# Patient Record
Sex: Male | Born: 1985 | Hispanic: No | Marital: Single | Smoking: Current every day smoker
Health system: Southern US, Community
[De-identification: ages and names within clinical notes are randomized; demographics above are authoritative.]

---

## 2014-06-16 ENCOUNTER — Other Ambulatory Visit (HOSPITAL_COMMUNITY): Payer: Self-pay | Admitting: PHYSICIAN ASSISTANT

## 2014-06-16 DIAGNOSIS — R160 Hepatomegaly, not elsewhere classified: Secondary | ICD-10-CM

## 2014-06-16 DIAGNOSIS — R748 Abnormal levels of other serum enzymes: Secondary | ICD-10-CM

## 2014-07-03 ENCOUNTER — Ambulatory Visit
Admission: RE | Admit: 2014-07-03 | Discharge: 2014-07-03 | Disposition: A | Discharge status: Home / Self Care | Payer: No Typology Code available for payment source | Source: Ambulatory Visit | Attending: PHYSICIAN ASSISTANT | Admitting: PHYSICIAN ASSISTANT

## 2014-07-03 DIAGNOSIS — R748 Abnormal levels of other serum enzymes: Secondary | ICD-10-CM

## 2014-07-03 DIAGNOSIS — R160 Hepatomegaly, not elsewhere classified: Secondary | ICD-10-CM

## 2014-07-03 MED ORDER — IOPAMIDOL 300 MG IODINE/ML (61 %) INTRAVENOUS SOLUTION
120.00 mL | INTRAVENOUS | Status: AC
Start: 2014-07-03 — End: 2014-07-03
  Administered 2014-07-03: 120 mL via INTRAVENOUS

## 2014-07-03 MED ADMIN — CEFTRIAXONE IVPB PEDIATRIC - 10G VIAL PREP: INTRAVENOUS | @ 10:00:00

## 2014-08-22 ENCOUNTER — Other Ambulatory Visit (HOSPITAL_COMMUNITY): Payer: Self-pay | Admitting: PHYSICIAN ASSISTANT

## 2014-08-22 DIAGNOSIS — R932 Abnormal findings on diagnostic imaging of liver and biliary tract: Secondary | ICD-10-CM

## 2015-05-07 ENCOUNTER — Other Ambulatory Visit (HOSPITAL_COMMUNITY): Payer: Self-pay | Admitting: PHYSICIAN ASSISTANT

## 2015-05-07 DIAGNOSIS — N320 Bladder-neck obstruction: Secondary | ICD-10-CM

## 2015-05-16 ENCOUNTER — Emergency Department (EMERGENCY_DEPARTMENT_HOSPITAL): Payer: No Typology Code available for payment source

## 2015-05-16 ENCOUNTER — Emergency Department (HOSPITAL_COMMUNITY): Payer: No Typology Code available for payment source

## 2015-05-16 ENCOUNTER — Other Ambulatory Visit (HOSPITAL_COMMUNITY): Payer: Self-pay

## 2015-05-16 ENCOUNTER — Emergency Department
Admission: EM | Admit: 2015-05-16 | Discharge: 2015-05-17 | Disposition: A | Discharge status: Home / Self Care | Payer: No Typology Code available for payment source | Attending: Emergency Medicine | Admitting: Emergency Medicine

## 2015-05-16 DIAGNOSIS — R112 Nausea with vomiting, unspecified: Secondary | ICD-10-CM | POA: Insufficient documentation

## 2015-05-16 DIAGNOSIS — M79672 Pain in left foot: Secondary | ICD-10-CM | POA: Insufficient documentation

## 2015-05-16 DIAGNOSIS — R079 Chest pain, unspecified: Secondary | ICD-10-CM

## 2015-05-16 DIAGNOSIS — R1013 Epigastric pain: Principal | ICD-10-CM | POA: Insufficient documentation

## 2015-05-16 DIAGNOSIS — Z7952 Long term (current) use of systemic steroids: Secondary | ICD-10-CM | POA: Insufficient documentation

## 2015-05-16 LAB — CBC WITH DIFF
BASOPHIL #: 0.04 x10ˆ3/uL (ref 0.00–0.20)
BASOPHIL %: 0 %
EOSINOPHIL #: 0.03 x10ˆ3/uL (ref 0.00–0.50)
EOSINOPHIL %: 0 %
HCT: 42.4 % (ref 36.7–47.0)
HGB: 13.6 g/dL (ref 12.5–16.3)
LYMPHOCYTE #: 0.67 x10ˆ3/uL — ABNORMAL LOW (ref 1.00–4.80)
LYMPHOCYTE %: 4 %
MCH: 28 pg (ref 27.4–33.0)
MCHC: 32.1 g/dL — ABNORMAL LOW (ref 32.5–35.8)
MCHC: 32.1 g/dL — ABNORMAL LOW (ref 32.5–35.8)
MCV: 87.2 fL (ref 78.0–100.0)
MCV: 87.2 fL (ref 78.0–100.0)
MONOCYTE #: 0.84 x10?3/uL (ref 0.30–1.00)
MONOCYTE %: 5 %
MPV: 9.9 fL (ref 7.5–11.5)
NEUTROPHIL #: 14.71 x10ˆ3/uL — ABNORMAL HIGH (ref 1.50–7.70)
NEUTROPHIL %: 90 %
PLATELETS: 164 x10ˆ3/uL (ref 140–450)
RBC: 4.86 x10ˆ6/uL (ref 4.06–5.63)
RDW: 13.3 % (ref 12.0–15.0)
WBC: 16.3 x10ˆ3/uL — ABNORMAL HIGH (ref 3.5–11.0)

## 2015-05-16 LAB — COMPREHENSIVE METABOLIC PANEL, NON-FASTING
ALBUMIN: 4.4 g/dL (ref 3.5–5.0)
ALKALINE PHOSPHATASE: 72 U/L (ref <150)
ALT (SGPT): 52 U/L (ref <55)
ANION GAP: 8 mmol/L (ref 4–13)
AST (SGOT): 68 U/L — ABNORMAL HIGH (ref 8–48)
BILIRUBIN TOTAL: 1.3 mg/dL (ref 0.3–1.3)
BUN/CREA RATIO: 18 (ref 6–22)
BUN: 24 mg/dL (ref 8–25)
CALCIUM: 10.4 mg/dL (ref 8.5–10.4)
CHLORIDE: 102 mmol/L (ref 96–111)
CO2 TOTAL: 28 mmol/L (ref 22–32)
CREATININE: 1.3 mg/dL — ABNORMAL HIGH (ref 0.62–1.27)
ESTIMATED GFR: >59 mL/min/1.73mˆ2 (ref >59)
GLUCOSE: 103 mg/dL (ref 65–139)
POTASSIUM: 4.9 mmol/L (ref 3.5–5.1)
PROTEIN TOTAL: 7.7 g/dL (ref 6.4–8.3)
SODIUM: 138 mmol/L (ref 136–145)
SODIUM: 138 mmol/L (ref 136–145)

## 2015-05-16 LAB — VENOUS BLOOD GAS/LACTATE (TEMP COMP) - INACTIVE
%FIO2 (VENOUS): 21 %
(T) PCO2: 49 mmHg (ref 41.0–51.0)
(T) PO2: 27 mmHg — ABNORMAL LOW (ref 35.0–50.0)
BASE EXCESS: 2.2 mmol/L (ref ?–3.0)
BICARBONATE (VENOUS): 25.3 mmol/L (ref 22.0–26.0)
LACTATE: 1.1 mmol/L (ref 0.0–1.3)
PCO2 (VENOUS): 49 mmHg (ref 41.00–51.00)
PH (T): 7.37 (ref 7.31–7.41)
PH (VENOUS): 7.37 (ref 7.31–7.41)
PO2 (VENOUS): 27 mmHg — ABNORMAL LOW (ref 35.0–50.0)
TEMPERATURE, COMP: 37 C (ref 15.0–40.0)

## 2015-05-16 LAB — AMMONIA: AMMONIA: 21 umol/L (ref 15–50)

## 2015-05-16 LAB — PHOSPHORUS: PHOSPHORUS: 3.7 mg/dL (ref 2.4–4.7)

## 2015-05-16 LAB — PT/INR
INR: 1.13 (ref 0.80–1.20)
INR: 1.13 (ref 0.80–1.20)
PROTHROMBIN TIME: 12.6 s (ref 9.0–13.4)
PROTHROMBIN TIME: 12.6 seconds (ref 9.0–13.4)

## 2015-05-16 LAB — LIPASE
LIPASE: 28 U/L (ref 10–80)
LIPASE: 28 U/L (ref 10–80)

## 2015-05-16 LAB — PTT (PARTIAL THROMBOPLASTIN TIME): APTT: 30.9 s (ref 25.1–36.5)

## 2015-05-16 LAB — MAGNESIUM: MAGNESIUM: 1.9 mg/dL (ref 1.6–2.5)

## 2015-05-16 MED ORDER — ONDANSETRON HCL (PF) 4 MG/2 ML INJECTION SOLUTION
4.0000 mg | INTRAMUSCULAR | Status: AC
Start: 2015-05-16 — End: 2015-05-16
  Administered 2015-05-16: 4 mg via INTRAVENOUS
  Filled 2015-05-16: qty 2

## 2015-05-16 MED ORDER — SODIUM CHLORIDE 0.9 % IV BOLUS
1000.0000 mL | INJECTION | Status: AC
Start: 2015-05-16 — End: 2015-05-16
  Administered 2015-05-16: 1000 mL via INTRAVENOUS

## 2015-05-16 MED ORDER — GLYCOPYRROLATE 1 MG TABLET
1.00 mg | ORAL_TABLET | Freq: Three times a day (TID) | ORAL | Status: AC
Start: 2015-05-16 — End: ?

## 2015-05-16 MED ORDER — ONDANSETRON 4 MG DISINTEGRATING TABLET
4.00 mg | ORAL_TABLET | Freq: Three times a day (TID) | ORAL | Status: AC | PRN
Start: 2015-05-16 — End: ?

## 2015-05-16 MED ORDER — FAMOTIDINE 20 MG TABLET
20.00 mg | ORAL_TABLET | Freq: Two times a day (BID) | ORAL | Status: AC
Start: 2015-05-16 — End: ?

## 2015-05-16 NOTE — ED Attending Note (Signed)
I was physically present and directly supervised this patient's care. Patient was seen and examined. The midlevel's/resident's history and exam were reviewed. Key elements in addition to and/or correction of that documentation are as follows:    Chief Complaint   Patient presents with    Chest Pain      pt states having SOB and CP after eating lunch today. pt seen at [facility] center and referred here. pt states may be his gall bladder.        William Moses is a 29 y.o. male p/w epigastric and chest pain after n/v after having cheeseburger and fries. Pt with epigastric pain. Pt here with n/v. Pt blood streaked stools for past 4 weeks. Pt is a prisoner.     Pertinent Exam  Filed Vitals:    05/16/15 1640   BP: 106/58   Pulse: 92   Temp: 37.2 C (99 F)   Resp: 18   SpO2: 97%     Alert NAD  RRR, no r/m/g  CTAB, no resp distress  Abd s, epigastric ttp, nd, no peritoneal signs, mild voluntary guarding    Course      Results reviewed.       Impressions:   Encounter Diagnosis   Name Primary?    Epigastric pain Yes       Dispo: Pt care signed out to Dr. Hetty Ely pending re-eval    Herschel Senegal, MD 05/16/2015, 17:55  Emergency Medicine Attending

## 2015-05-16 NOTE — Procedures (Signed)
Encounter Date: 05/16/2015     Emergency Department Procedure:    Gallbladder Ultrasound:    Indication:: Epigastric pain, Vomiting  Negative findings:: No Stones Visualized, No GB Wall Thickening Seen, No Pericholecystic Fluid Seen, No Sonographic Murphy's Sign Noted, CBD Not Dilated, Max CBD Diameter  Max CBD Diameter: 0.3 cm  Summary:: Negative within limits & scope of exam  Attending Physician: Delight Ovens

## 2015-05-16 NOTE — ED Provider Notes (Signed)
Department of Emergency Medicine  HPI - 05/16/2015    Attending Physician: Dr. Lawernce Pitts  Resident Physician: Dr. Retta Mac    Chief Complaint:   CP    History of Present Illness:   William Moses, 29 y.o. male. Per pt, today after lunch (ate a hamburger, french fries, and cheesecake) he developed acute onset of sharp/cramping lowerCP/abdominal pain under his lower ribs; at onset rated at 8-9/10. Pt states that after onset of pain he went to work and had to leave due to vomiting and diarrhea. Pt reports that he had 6-7 episodes of vomiting and a BM with quality of diarrhea and associated blood in stool (hematochezia has been an ongoing problem for 2 months, pt under evaluation). Pt notes that he had pain with BM. Pt expresses that after BM his pain was reduced to a 6-7/10. Pt describes that he vomited up all of his lunch and after that the vomit consisted of liquid. Currently, the pt rates his pain at a 4-5/10 and the pain is aggravated by pressing on the upper abdomen, deep breathing, burping, and gagging. Pt states that while vomiting he had fever and chills but that has since resolved. Pt denies dysuria, hematuria, lightheadedness, SOB, abnormal numbness/tingling, and weakness. Pt has no h/o DVT, PE, MI, or stroke. FMHx blood clot in mother.PA-C at the "[place] [facility]" evaluated pt after onset of his symptoms and he was suspected to have acute cholecystitis due to eating greasy food. Pt was sent to [facility] for further evaluation. Pt is currently undergoing treatment (Cortisone shots) for L foot pain, numbness, and tingling. Pt also notes that he has a CT scan next week for ongoing bladder pain. H/o CAT scan of upper body for liver issue. Pt's significant PMHx includes unspecified episodic mood disorder, depressive DO, tooth eruption, abnormal liver function study, neoplasm, plantar fascitis fibromatosis L foot, constipation, and hemorrhoids. H/o noncompliance with medication  treatment. NKDA.     History Limitations: none    Review of Systems:   Constitutional: No fever, chills or weakness   Skin: No rashes or diaphoresis   HENT: No headaches, congestion   Eyes: No vision changes, discharge   Cardio: No chest pain, palpitations or leg swelling    Respiratory: No cough, wheezing or SOB   GI:  No nausea or constipation. +abdominal pain +vomiting +diarrhea +hematochezia   GU:  No dysuria, hematuria, polyuria   MSK: No joint or back pain   Neuro: No loss of sensation, focal deficits or LOC   Psych: No SI, HI or substance abuse.    All other systems reviewed and are negative.    Medications:  None       Allergies:  No Known Allergies    Past Medical History:  No past medical history on file.      Past Surgical History:  No past surgical history on file.        Social History:  Social History     Social History    Marital Status: Unknown     Spouse Name: N/A    Number of Children: N/A    Years of Education: N/A     Occupational History    Not on file.     Social History Main Topics    Smoking status: Not on file    Smokeless tobacco: Not on file    Alcohol Use: Not on file    Drug Use: Not on file    Sexual Activity:  Not on file     Other Topics Concern    Not on file     Social History Narrative    No narrative on file       Family History:  No family history on file.        Physical Exam:  All nurse's notes reviewed.  Filed Vitals:    05/16/15 1640   BP: 106/58   Pulse: 92   Temp: 37.2 C (99 F)   Resp: 18   SpO2: 97%        Patient does not need supplemental oxygen     Constitutional: NAD. A+Ox3   HENT:    Head: NC AT    Mouth/Throat: Oropharynx is clear and moist.    Eyes: Normal lids, Conjunctivae without discharge   Neck: Trachea midline.    Cardiovascular: RRR, No murmurs, rubs or gallops.    Pulmonary/Chest: BS equal bilaterally, good air movement. No respiratory distress. No wheezes, rales or chest tenderness.    Abdominal: BS +. Abdomen soft or  rebound. No RUQ TTP. Negative Murphy's. Voluntary guarding. Epigastric TTP.    Musculoskeletal: No obvious deformity, swelling,    Skin: Warm and dry. No rash, erythema, pallor or cyanosis   Psychiatric: Behavior is normal. Mood and affect congruent.     Neurological: Alert&Ox3. Grossly intact.       Labs:  Results for orders placed or performed during the hospital encounter of 05/16/15 (from the past 24 hour(s))   CBC/DIFF    Narrative    The following orders were created for panel order CBC/DIFF.  Procedure                               Abnormality         Status                     ---------                               -----------         ------                     CBC WITH DIFF[[id number]]                Abnormal            Final result                 Please view results for these tests on the individual orders.   LIPASE   Result Value Ref Range    LIPASE 28 10-80 U/L   MAGNESIUM   Result Value Ref Range    MAGNESIUM 1.9 1.6-2.5 mg/dL   PHOSPHORUS   Result Value Ref Range    PHOSPHORUS 3.7 2.4-4.7 mg/dL   AMMONIA   Result Value Ref Range    AMMONIA 21 15-50 umol/L   COMPREHENSIVE METABOLIC PANEL, NON-FASTING   Result Value Ref Range    SODIUM 138 136-145 mmol/L    POTASSIUM 4.9 3.5-5.1 mmol/L    CHLORIDE 102 96-111 mmol/L    CO2 TOTAL 28 22-32 mmol/L    ANION GAP 8 4-13 mmol/L    BUN 24 8-25 mg/dL    CREATININE 1.30 (H) 0.62-1.27 mg/dL    BUN/CREA RATIO 18 6-22    ESTIMATED GFR >59 >59 mL/min/1.67m2  ALBUMIN 4.4 3.5-5.0 g/dL    CALCIUM 10.4 8.5-10.4 mg/dL    GLUCOSE 103 65-139 mg/dL    ALKALINE PHOSPHATASE 72 <150 U/L    ALT (SGPT) 52 <55 U/L    AST (SGOT) 68 (H) 8-48 U/L    BILIRUBIN TOTAL 1.3 0.3-1.3 mg/dL    PROTEIN TOTAL 7.7 6.4-8.3 g/dL   PT/INR   Result Value Ref Range    PROTHROMBIN TIME 12.6 9.0-13.4 seconds    INR 1.13 0.80-1.20    Narrative    Coumadin therapy INR range for Conventional Anticoagulation is 2.0 to 3.0 and for Intensive Anticoagulation 2.5 to 3.5.   PTT (PARTIAL THROMBOPLASTIN  TIME)   Result Value Ref Range    APTT 30.9 25.1-36.5 seconds    Narrative    Therapeutic range for unfractionated heparin is 60.0-100.0 seconds.   URINALYSIS, MACROSCOPIC AND MICROSCOPIC W/CULTURE REFLEX    Narrative    The following orders were created for panel order URINALYSIS, MACROSCOPIC AND MICROSCOPIC W/CULTURE REFLEX.  Procedure                               Abnormality         Status                     ---------                               -----------         ------                     URINALYSIS, MACROSCOPIC[[id number]]                                                     URINALYSIS, MICROSCOPIC[[id number]]                                                       Please view results for these tests on the individual orders.   VENOUS BLOOD GAS/LACTATE (TEMP COMP)   Result Value Ref Range    %FIO2 21.0 %    PH 7.37 7.31-7.41    PCO2 49.00 41.00-51.00 mm/Hg    PO2 27.0 (L) 35.0-50.0 mm/Hg    BASE EXCESS 2.2 -3.0-3.0 mmol/L    BICARBONATE 25.3 22.0-26.0 mmol/L    TEMPERATURE, COMP 37.0 15.0-40.0 C    pCO2, Total 49.0 41.0-51.0 mm/Hg    (T) PO2 27.0 (L) 35.0-50.0 mm/Hg    LACTATE 1.1 0.0-1.3 mmol/L    PH (T) 7.37 7.31-7.41   CBC WITH DIFF   Result Value Ref Range    WBC 16.3 (H) 3.5-11.0 x103/uL    RBC 4.86 4.06-5.63 x106/uL    HGB 13.6 12.5-16.3 g/dL    HCT 42.4 36.7-47.0 %    MCV 87.2 78.0-100.0 fL    MCH 28.0 27.4-33.0 pg    MCHC 32.1 (L) 32.5-35.8 g/dL    RDW 13.3 12.0-15.0 %    PLATELETS 164 140-450 x103/uL    MPV 9.9 7.5-11.5 fL    NEUTROPHIL % 90 %  LYMPHOCYTE % 4 %    MONOCYTE % 5 %    EOSINOPHIL % 0 %    BASOPHIL % 0 %    NEUTROPHIL # 14.71 (H) 1.50-7.70 x103/uL    LYMPHOCYTE # 0.67 (L) 1.00-4.80 x103/uL    MONOCYTE # 0.84 0.30-1.00 x103/uL    EOSINOPHIL # 0.03 0.00-0.50 x103/uL    BASOPHIL # 0.04 0.00-0.20 x103/uL       Imaging:    Results for orders placed or performed during the hospital encounter of 05/16/15 (from the past 72 hour(s))   ED [place] LIMITED GALLBLADDER     Status: None     Narrative    *Exam not read by Radiology.  *Refer to ED note for result.   XR CHEST PA     Status: None    Narrative    Ravi Westley Foots  XR CHEST PA performed on May 16, 2015  6:55 PM.    CLINICAL HISTORY: 29 y.o. male with chest pain.    A single PA view was obtained.    COMPARISON: None    FINDINGS:   There is no evidence of focal consolidation, pleural effusion, or   pneumothorax.  The heart size is within normal limits.  No significant   bony abnormalities are seen.      Impression    No acute cardiopulmonary process.            Orders Placed This Encounter    BEDSIDE  MISC PROCEDURE    ED [place] LIMITED GALLBLADDER    CANCELED: XR AP MOBILE CHEST    XR CHEST PA    CBC/DIFF    LIPASE    MAGNESIUM    PHOSPHORUS    AMMONIA    COMPREHENSIVE METABOLIC PANEL, NON-FASTING    PT/INR    PTT (PARTIAL THROMBOPLASTIN TIME)    URINALYSIS, MACROSCOPIC AND MICROSCOPIC W/CULTURE REFLEX    VENOUS BLOOD GAS/LACTATE (TEMP COMP)    CBC WITH DIFF    URINALYSIS, MACROSCOPIC    URINALYSIS, MICROSCOPIC    ECG 12-LEAD    SCHEDULE FOLLOW-UP MED SPEC - GASTROENTEROLOGY - PHYSICIAN OFFICE CENTER    ondansetron (ZOFRAN) 2 mg/mL injection    NS bolus infusion 1,000 mL    ondansetron (ZOFRAN ODT) 4 mg Oral Tablet, Rapid Dissolve    glycopyrrolate (ROBINUL) 1 mg Oral Tablet    famotidine (PEPCID) 20 mg Oral Tablet       Abnormal Lab results:  Labs Reviewed   COMPREHENSIVE METABOLIC PANEL, NON-FASTING - Abnormal; Notable for the following:     CREATININE 1.30 (*)     AST (SGOT) 68 (*)     All other components within normal limits   VENOUS BLOOD GAS/LACTATE (TEMP COMP) - Abnormal; Notable for the following:     PO2 27.0 (*)     (T) PO2 27.0 (*)     All other components within normal limits   CBC WITH DIFF - Abnormal; Notable for the following:     WBC 16.3 (*)     MCHC 32.1 (*)     NEUTROPHIL # 14.71 (*)     LYMPHOCYTE # 0.67 (*)     All other components within normal limits   LIPASE - Normal   MAGNESIUM - Normal      PHOSPHORUS - Normal   AMMONIA - Normal   PT/INR - Normal    Narrative:     Coumadin therapy INR range for Conventional Anticoagulation is 2.0 to 3.0 and for Intensive Anticoagulation  2.5 to 3.5.   PTT (PARTIAL THROMBOPLASTIN TIME) - Normal    Narrative:     Therapeutic range for unfractionated heparin is 60.0-100.0 seconds.   CBC/DIFF    Narrative:     The following orders were created for panel order CBC/DIFF.  Procedure                               Abnormality         Status                     ---------                               -----------         ------                     CBC WITH DIFF[[id number]]                Abnormal            Final result                 Please view results for these tests on the individual orders.   URINALYSIS, MACROSCOPIC AND MICROSCOPIC W/CULTURE REFLEX    Narrative:     The following orders were created for panel order URINALYSIS, MACROSCOPIC AND MICROSCOPIC W/CULTURE REFLEX.  Procedure                               Abnormality         Status                     ---------                               -----------         ------                     URINALYSIS, MACROSCOPIC[[id number]]                                                     URINALYSIS, MICROSCOPIC[[id number]]                                                       Please view results for these tests on the individual orders.   URINALYSIS, MACROSCOPIC   URINALYSIS, MICROSCOPIC        ECG: Normal sinus rhythm  Early repolarization  Normal ECG  No ischemic changes concerning for MI, No arrhythmia      Plan: Appropriate labs and imaging ordered to evaluate for cholecystitis. Medical Records reviewed.    Therapy/Procedures/Course/MDM:   Patient was vitally stable throughout visit. PE unconcerning for acute abdomen, and pain resolving without pain medication favors due to gastritis vs. Gastroenteritis. Persistent bloody stools with positive FOBT merit additional followup for this h/d stable patient.  Imaging showed: XR  Chest PA  unremarkable. See Gallbladder US note, no sign of cholecystitis.  Lab results remarkable for: Creatinine 1.30  Patient received: 1L NS Bolus and Zofran.  Results discussed with patient.  He had improvement with initial ED management. They were given the opportunity to ask questions.    Patient to follow up with [facility] in 4 days or PCP in 3 days.    Consults: none  Impression:    Encounter Diagnosis   Name Primary?    Epigastric pain Yes     Disposition:  Discharged     Following the above history, physical exam, and studies, the patient was deemed stable and suitable for discharge.   he will follow up with [facility] in 4 weeks and PCP in 3 days.   Robinul, Zofran, and Pepcid was prescribed.  Medication instructions were discussed with the patient/patient's family.   It was advised that the patient return to the ED with any new, concerning or worsening symptoms and follow up as directed.    The patient's family verbalized understanding of all instructions and had no further questions or concerns.     Follow Up:  Given, None  [address]  [place] [address]    In 1 week      [facility]  [place] [address]  [phone]          Prescriptions:   New Prescriptions    FAMOTIDINE (PEPCID) 20 MG ORAL TABLET    Take 1 Tab (20 mg total) by mouth Twice daily    GLYCOPYRROLATE (ROBINUL) 1 MG ORAL TABLET    Take 1 Tab (1 mg total) by mouth Three times a day    ONDANSETRON (ZOFRAN ODT) 4 MG ORAL TABLET, RAPID DISSOLVE    Take 1 Tab (4 mg total) by mouth Every 8 hours as needed for nausea/vomiting       Future Appointments  Date Time Provider [place]   05/25/2015 10:30 AM UTC [place] [facility]        I am scribing for, and in the presence of, Dr. Retta Mac for services provided on 05/16/2015  [facility], SCRIBE   I personally performed the services described in this documentation, as scribed  in my presence, and it is both accurate  and  complete.    Lonell Face, MD  05/17/2015, 13:50

## 2015-05-16 NOTE — ED Nurses Note (Signed)
Pt resting in bed, no complaints noted.

## 2015-05-16 NOTE — ED Nurses Note (Signed)
Pt PO challenged and waiting for a private room for rectal exam then disposition.  Pt requesting to leave ED, E. Lidstone notified.

## 2015-05-16 NOTE — Discharge Instructions (Signed)
Please follow up with your PCP within 3 days and with [facility] within the next 4 weeks. Please return to the ED for any new, worsening, or concerning symptoms. Thank you for allowing [place] to participate in your care.

## 2015-05-16 NOTE — ED Attending Handoff Note (Signed)
Patient checked out to me by prior attending. The plan for evaluation, management, and disposition for the patient was unchanged from that of the prior attending. Exceptions are outlined subsequently.    Nausea and vomiting. Getting po challenge now. Likely discharge after.

## 2015-05-16 NOTE — ED Nurses Note (Signed)
Initial care of pt taken over by myself, no previous report received.  Pt in ED from [place] [facility] with c/o epigastric pain.  Pt states the pain started early this afternoon.  Pt denies any pain radiating, describes the pain as an ache with associated nausea, denies vomiting.  Pt rates pain 4/10 on pain scale.  Pt denies any fever/ chills or SOB.  Pt states he may have an allergy to gluten and is suppose to have follow up and CT scan of his gallbladder.  IV line placed, labs obtained and sent to lab per order.

## 2015-05-16 NOTE — ED Nurses Note (Signed)
1L NS bolus infusion started per order and 4mg  IVP Zofran given per order for nausea.

## 2015-05-16 NOTE — ED Nurses Note (Signed)
Pt discharged to home as per order.  Pt states understanding of followup and prescriptions.  piv removed, catheter intact.  Pt ambulatory at discharge, waiting for ride back to [place] center.

## 2015-05-17 LAB — ECG 12-LEAD
Atrial Rate: 79 {beats}/min
Calculated P Axis: 66 degrees
Calculated R Axis: 82 degrees
PR Interval: 190 ms
QT Interval: 340 ms
QTC Calculation: 389 ms
Ventricular rate: 79 {beats}/min

## 2015-05-25 ENCOUNTER — Ambulatory Visit
Admission: RE | Admit: 2015-05-25 | Discharge: 2015-05-25 | Disposition: A | Discharge status: Home / Self Care | Payer: No Typology Code available for payment source | Source: Ambulatory Visit | Attending: PHYSICIAN ASSISTANT | Admitting: PHYSICIAN ASSISTANT

## 2015-05-25 DIAGNOSIS — N320 Bladder-neck obstruction: Secondary | ICD-10-CM | POA: Insufficient documentation

## 2015-05-25 MED ORDER — FUROSEMIDE 10 MG/ML INJECTION SOLUTION
10.00 mg | INTRAMUSCULAR | Status: DC
Start: 2015-05-25 — End: 2015-05-26

## 2015-05-25 MED ORDER — IOPAMIDOL 300 MG IODINE/ML (61 %) INTRAVENOUS SOLUTION
100.00 mL | INTRAVENOUS | Status: AC
Start: 2015-05-25 — End: 2015-05-25
  Administered 2015-05-25: 11:00:00 100 mL via INTRAVENOUS

## 2015-10-02 ENCOUNTER — Ambulatory Visit (HOSPITAL_BASED_OUTPATIENT_CLINIC_OR_DEPARTMENT_OTHER): Payer: No Typology Code available for payment source

## 2015-10-02 ENCOUNTER — Ambulatory Visit
Payer: No Typology Code available for payment source | Attending: Dermatology | Admitting: Student in an Organized Health Care Education/Training Program

## 2015-10-02 ENCOUNTER — Other Ambulatory Visit (HOSPITAL_BASED_OUTPATIENT_CLINIC_OR_DEPARTMENT_OTHER): Payer: Self-pay | Admitting: Dermatology

## 2015-10-02 VITALS — BP 118/78 | Ht 72.01 in | Wt 177.9 lb

## 2015-10-02 DIAGNOSIS — L989 Disorder of the skin and subcutaneous tissue, unspecified: Secondary | ICD-10-CM

## 2015-10-02 DIAGNOSIS — L509 Urticaria, unspecified: Secondary | ICD-10-CM

## 2015-10-02 DIAGNOSIS — D2271 Melanocytic nevi of right lower limb, including hip: Secondary | ICD-10-CM

## 2015-10-02 DIAGNOSIS — Z872 Personal history of diseases of the skin and subcutaneous tissue: Secondary | ICD-10-CM | POA: Insufficient documentation

## 2015-10-02 MED ORDER — TRIAMCINOLONE ACETONIDE 0.1 % TOPICAL OINTMENT
TOPICAL_OINTMENT | Freq: Two times a day (BID) | CUTANEOUS | 2 refills | Status: AC
Start: 2015-10-02 — End: ?

## 2015-10-02 MED ORDER — FEXOFENADINE 180 MG TABLET
180.0000 mg | ORAL_TABLET | Freq: Every day | ORAL | 2 refills | Status: AC
Start: 2015-10-02 — End: ?

## 2015-10-02 NOTE — Progress Notes (Signed)
10/02/15 1400   Medication Administration   Initials SI   Medication  (Lidocaine with epi)   Medication Dose 1cc   Site (right 2nd toe)   NDC # [id number]   LOT # [id number]   Expiration date 09/18/16   Manufacturer [place] Yes   Patient Supplied No   Comments: Medication administered per Dr. Raphael Gibney

## 2015-10-02 NOTE — Procedures (Addendum)
See progress note  William Frasier, MD

## 2015-10-02 NOTE — Addendum Note (Signed)
Addended by: Mannie Stabile on: 10/02/2015 03:31 PM     Modules accepted: Orders

## 2015-10-02 NOTE — Progress Notes (Addendum)
Subjective:       Patient ID: William Moses is a 30 y.o. male     Chief Complaint:     Chief Complaint   Patient presents with    Skin Check        HPI  29 year old with no personal or family hx of skin cancer present with a lesion on his right 2nd toe that has been present for 2 year. Pt states it is growing in size. He also complains of pruritus and red raised lesion on his skin more specificaly his neck, chest, hands, and feets after exercising. Fm hx of urticaria. He otherwise denies any new, changing, bleeding, or rapidly growing lesions and has no other skin-related complaints.    Review of Systems   Constitutional: Negative for chills and fever.   Skin: Negative for color change, pallor and rash.       Current Outpatient Prescriptions   Medication Sig    famotidine (PEPCID) 20 mg Oral Tablet Take 1 Tab (20 mg total) by mouth Twice daily (Patient not taking: Reported on 10/02/2015)    glycopyrrolate (ROBINUL) 1 mg Oral Tablet Take 1 Tab (1 mg total) by mouth Three times a day (Patient not taking: Reported on 10/02/2015)    Ibuprofen (MOTRIN) 100 mg Oral Tablet Take 100 mg by mouth Four times a day as needed for Pain    ondansetron (ZOFRAN ODT) 4 mg Oral Tablet, Rapid Dissolve Take 1 Tab (4 mg total) by mouth Every 8 hours as needed for nausea/vomiting (Patient not taking: Reported on 10/02/2015)       Objective:   Marland Kitchen   Vitals:    10/02/15 1338   BP: 118/78   Weight: 80.7 kg (177 lb 14.6 oz)   Height: 1.829 m (6' 0.01")       Physical Exam   Constitutional: He appears well-nourished. No distress.   Skin:          General skin exam was performed including head, neck, anterior and posterior trunk, bilateral upper, and lower extremities and revealed no areas of concern other than those documented.    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 Consent obtained, area cleaned, and anesthetized. A shave biopsy was performed.  Aluminum chloride was used for hemostasis. Vaseline and bandage were placed over the wound and wound care instructions were given. Patient demonstrated understanding of the instructions. Patient was advised that it would take approximately 2-3 weeks for the pathology results to be available and that I will personally contact the patient at the number provided to further discuss the pathology results.  - Recommended using sunscreen with SPF at least 30 and re-apply every 2-3 hours.  - ABCDE's of melanoma (Asymmetry, Border irregularity, Color variation, Diameter (>6 mm), and Evolution of lesion) were reviewed with the patient. Recommended monthly self-evaluation, photoprotection, and return to clinic with any new or changing moles.  - Return in 6 months    Mannie Stabile, MD  10/02/2015, 15:16    See resident's note for details. I saw and examined the  patient and agree with the resident's findings and plan as written except as noted and I was present and supervised/observed the entire procedure.    Bonnetta Barry, MD

## 2015-10-05 LAB — HISTORICAL SURGICAL PATHOLOGY SPECIMEN

## 2015-10-11 ENCOUNTER — Encounter (HOSPITAL_BASED_OUTPATIENT_CLINIC_OR_DEPARTMENT_OTHER): Payer: Self-pay | Admitting: Student in an Organized Health Care Education/Training Program

## 2015-12-31 ENCOUNTER — Encounter (HOSPITAL_BASED_OUTPATIENT_CLINIC_OR_DEPARTMENT_OTHER)
Payer: No Typology Code available for payment source | Admitting: Student in an Organized Health Care Education/Training Program

## 2017-07-24 ENCOUNTER — Encounter (HOSPITAL_COMMUNITY): Payer: Self-pay

## 2017-07-24 ENCOUNTER — Emergency Department (HOSPITAL_COMMUNITY)
Admission: EM | Admit: 2017-07-24 | Discharge: 2017-07-24 | Disposition: A | Discharge status: Home / Self Care | Attending: Emergency Medicine | Admitting: Emergency Medicine

## 2017-07-24 ENCOUNTER — Other Ambulatory Visit: Payer: Self-pay

## 2017-07-24 DIAGNOSIS — F17210 Nicotine dependence, cigarettes, uncomplicated: Secondary | ICD-10-CM | POA: Diagnosis not present

## 2017-07-24 DIAGNOSIS — R440 Auditory hallucinations: Secondary | ICD-10-CM

## 2017-07-24 NOTE — ED Provider Notes (Signed)
[facility]-EMERGENCY DEPT Provider Note   CSN: [id number] Arrival date & time: 07/24/17  1649     History   Chief Complaint Chief Complaint  Patient presents with  . Psychiatric Evaluation    HPI Chris Jackson is a 31 y.o. male.  Chris Jackson is a 31 y.o. Male who presents to the emergency department complaining of hearing voices.  He tells me he was sent from his halfway house to get evaluation for possibly restarting his medications.  He tells me he was diagnosed with schizophrenia when he was in prison.  He cannot provide many details about his diagnosis or where he was diagnosed.  He is unsure which medications he was taking.  He tells me he has not been taking medications for mental illness for about a year and a half.  He does tell me he feels irritated about hearing voices in his head.  He denies command hallucinations.  He denies visual hallucinations.  He denies SI or HI.  He denies feeling depressed or anxious.  He denies physical complaints.  He denies illicit substance abuse.  He is a smoker.   The history is provided by the patient and medical records. No language interpreter was used.    History reviewed. No pertinent past medical history.  There are no active problems to display for this patient.   History reviewed. No pertinent surgical history.     Home Medications    Prior to Admission medications   Not on File    Family History History reviewed. No pertinent family history.  Social History Social History   Tobacco Use  . Smoking status: Current Every Day Smoker    Packs/day: 0.25    Types: Cigarettes  . Smokeless tobacco: Never Used  Substance Use Topics  . Alcohol use: No    Frequency: Never  . Drug use: No     Allergies   Patient has no known allergies.   Review of Systems Review of Systems  Constitutional: Negative for chills and fever.  HENT: Negative for congestion and sore throat.   Eyes: Negative for  visual disturbance.  Respiratory: Negative for cough and shortness of breath.   Cardiovascular: Negative for chest pain.  Gastrointestinal: Negative for abdominal pain, diarrhea, nausea and vomiting.  Genitourinary: Negative for dysuria.  Musculoskeletal: Negative for back pain.  Skin: Negative for rash.  Neurological: Negative for headaches.  Psychiatric/Behavioral: Positive for hallucinations. Negative for dysphoric mood and self-injury. The patient is not nervous/anxious.      Physical Exam Updated Vital Signs BP (!) 145/64 (BP Location: Right Arm)   Pulse 72   Temp 97.9 F (36.6 C) (Oral)   Resp 16   Ht 5\' 11"  (1.803 m)   Wt 76.2 kg (168 lb)   SpO2 100%   BMI 23.43 kg/m   Physical Exam  Constitutional: He is oriented to person, place, and time. He appears well-developed and well-nourished. No distress.  HENT:  Head: Normocephalic and atraumatic.  Eyes: Conjunctivae are normal. Pupils are equal, round, and reactive to light. Right eye exhibits no discharge. Left eye exhibits no discharge.  Neck: Neck supple.  Cardiovascular: Normal rate, regular rhythm, normal heart sounds and intact distal pulses.  Pulmonary/Chest: Effort normal and breath sounds normal. No respiratory distress.  Abdominal: Soft. There is no tenderness.  Lymphadenopathy:    He has no cervical adenopathy.  Neurological: He is alert and oriented to person, place, and time. Coordination normal.  Skin: Skin is warm and  dry. No rash noted. He is not diaphoretic.  Psychiatric: He has a normal mood and affect. His behavior is normal. His mood appears not anxious. He does not exhibit a depressed mood. He expresses no homicidal and no suicidal ideation.  Patient is calm and cooperative.  He is pleasant and has good eye contact.  He does not appear to be responding to internal stimuli.  He does endorse auditory hallucinations.  He denies visual hallucinations.  He denies SI or HI.  He denies feeling depressed or  anxious.  Nursing note and vitals reviewed.    ED Treatments / Results  Labs (all labs ordered are listed, but only abnormal results are displayed) Labs Reviewed - No data to display  EKG  EKG Interpretation None       Radiology No results found.  Procedures Procedures (including critical care time)  Medications Ordered in ED Medications - No data to display   Initial Impression / Assessment and Plan / ED Course  I have reviewed the triage vital signs and the nursing notes.  Pertinent labs & imaging results that were available during my care of the patient were reviewed by me and considered in my medical decision making (see chart for details).     This is a 31 y.o. Male who presents to the emergency department complaining of hearing voices.  He tells me he was sent from his halfway house to get evaluation for possibly restarting his medications.  He tells me he was diagnosed with schizophrenia when he was in prison.  He cannot provide many details about his diagnosis or where he was diagnosed.  He is unsure which medications he was taking.  He tells me he has not been taking medications for mental illness for about a year and a half.  He does tell me he feels irritated about hearing voices in his head.  He denies command hallucinations.  He denies visual hallucinations.  He denies SI or HI.  He denies feeling depressed or anxious. On exam the patient is afebrile nontoxic-appearing.  He is calm and cooperative.  He is well groomed.  He has good eye contact.  He does not appear to be responding to internal stimuli.  He denies suicidal or homicidal ideations.  He does endorse visual hallucinations. Initially, patient agrees with plan for behavioral health to evaluate him here in the emergency department today. Short while later I was called to bedside by nursing staff.  They report patient is wanting to leave.  Patient tells me he is frustrated about waiting here for a long period  of time.  He feels he can go to the behavioral health clinic and not have to wait as long.  He tells me he does not feel he needs to be here waiting this long.  He still denies SI or HI.  I did recommend for him to stay for behavioral health evaluation but as he wants to leave will will let him go home and I provided him with outpatient resource guide.  He tells me he will follow-up there as an outpatient.  I discussed return precautions.  He does not appear to be a danger to himself or others. I advised the patient to follow-up with their primary care provider this week. I advised the patient to return to the emergency department with new or worsening symptoms or new concerns. The patient verbalized understanding and agreement with plan.   This patient was discussed with Dr. Madilyn Hookees who agrees with assessment  and plan.   Final Clinical Impressions(s) / ED Diagnoses   Final diagnoses:  Auditory hallucinations    ED Discharge Orders    None       Lorene DyDansie, Marlie Kuennen, PA-C 07/24/17 1937    Tilden Fossaees, Elizabeth, MD 07/26/17 [phone]

## 2017-07-24 NOTE — ED Notes (Signed)
Bed: WLPT3 Expected date:  Expected time:  Means of arrival:  Comments: 

## 2017-07-24 NOTE — ED Triage Notes (Addendum)
Patient called a half way house today and told staff that he was anxious and feels like he has angry thoughts. Patient denies wanting to hurt himself or other people.Patient denies any alcohol or drug use.Patient states he hears voices and they tell him great things. Patient sitting n triage laughing and waving his hands as if conversing.

## 2017-07-24 NOTE — BHH Counselor (Signed)
Per Leta JunglingJake, RN pt wants to be discharged and not assessed. No TTS consult is needed.   Redmond Pullingreylese D Nyoka Alcoser, MS, [facility], [facility] Triage Specialist [phone]

## 2017-07-24 NOTE — Discharge Instructions (Signed)
Substance Abuse Treatment Programs ° °Intensive Outpatient Programs °[facility]     °[address]      °[place], [place]                   °[phone]      ° °[facility] °[address] °[place], [place] °[phone] ° ° Behavioral Health Outpatient     °(Inpatient and outpatient)     °[address]           °[phone]   ° °[facility] °[phone] (Suboxone and Methadone) ° °[address]      °[place], [place] [address]      °336-882-2125      ° °[address] °[place], [place] °852-3033 ° °[facility] (Outpatient/Inpatient, Chemical)    °(insurance only) [phone]      °       °[facility] (Groups & Residential) °[place], [place] °[phone] ° °   °[facility]     °[address]     °[place], [place]      °[phone]      ° °[facility] (for caregivers and family) °[address] °[place], [place] °336-299-4655 ° ° ° ° ° °Residential Treatment Programs °[facility]      °[address], [place], [place] [address]  °([phone]      ° °[facility] °[address] [place], [place] [address] °[phone] ° °[facility]        °336-248-8914      ° °[facility] °1-800-659-3381 ° °[facility]             °[address]                                         °[place], [place]                                                °877-615-2722 or 336-784-9470                              ° °[facility] [place] °[address] °[place], [address] °1.877.941.8954 ° °[facility]    °[address]      °[place], [place]     °[phone]      ° °[facility] °[address] °, [place] °336-285-9073 ° °[facility]   °[address]     °[place], [place] [address]     °[phone]      °Admissions: 8am-3pm M-F ° °[facility] °[address] °[place],  [place] °336-227-7417 ° °[facility]: Residential Program (90 Days)   °[place], [place]      °336-725-8389 or 800-758-6077    ° °[facility] °[place], [place] °(Walk in Hours over the weekend or by referral) ° °[facility] °[address], [place],  [address] °([phone] ° °Crisis Mobile: [facility]:  1-877-626-1772 (for crisis response 24 hours a day) °[facility] Hotline:      1-800-256-2452 °Outpatient Psychiatry and Counseling ° °[facility]: Mobile Crisis   Management 24 hours:  1-877-626-1772 ° °[facility] sliding scale fee and walk in schedule: M-F 8am-12pm/1pm-3pm °[address]  °[place], [place] [address] °[phone] ° °[facility] °[address] °[place], [place] [address] °336-703-9650 ° °[facility] (Former[facility] known [facility])- new patient walk-in appointments available Monday - Friday 8am -3pm.          °[address] °[place], [place] [address] °336-676-6840 or crisis line- 336-676-6905 ° °[facility]/ Intensive Outpatient Therapy Program °[address] °[place], [place] [address] °336-832-9804 ° °[facility]                  °Crisis Services      °[phone]      °[address]     °[place], [place] [address]                ° °[facility]   °[facility] °[phone] °[address] °[place], [place] [address] ° ° °[facility]          °[address],  °[place], [place] [address]       °([phone] ° °[facility] °[address] °[place], [place] [address] °336-292-1510 ° °[facility]    °[address]    °[place], [place] [address]     °[phone]      ° °Parish McKinney, MD     °[address]     °[place] [address]     °336-282-1251     °  °[facility] °[address] °[place] [address] ° °[facility]     °[address]     °[place], [place]      °336-542-2076      ° °[facility] °Shamsher Ahluwalia, MD °[address] °[place], [place] [address] °[phone] ° °[facility]     °[address]     °[place], [place] [address]     °336-274-1237      ° °[facility] °[address] °[place], [place] [address] °336-854-4450 °Resources for Temporary Residential Assistance/Crisis Centers ° °DAY CENTERS °[facility] °M-F 8am-3pm   °[address] [place], [place] [address]   336-332-0824 °Services include: laundry, barbering, support groups, case management, phone  & computer access, showers, AA/NA mtgs, mental health/substance abuse nurse, job skills class, disability information, VA assistance, spiritual classes, etc.  ° °HOMELESS SHELTERS ° °[facility]     °[facility]   °[address], [place]     °[phone]       °       °[facility] (women and children)       °[address] °[place], [place] [address] °336-275-0820 °[email or web address] for application and process °Application Required ° °[facility]   °[address]    °[place] [address]     °[phone]       °             °[facility] °[address] °[place], [place] [address] °336.273.5572 °336-235-0363(schedule application appt.) °Application Required ° °[facility] (women only)    °[address]     °[place],  [address]     °336-884-1039      °  Intake starts 6pm daily °Need valid ID, SSC, & Police report °[facility] [place] °[address] °[place], [place] °336-881-5420 °Application Required ° °[facility] (men only)     °[address]      °[place], [place]     °[phone]      ° °[facility] °(Pregnant women only) °[address] °[place], [place] °336-275-0206 ° °[facility]      °[address]      °[place], [place] [address]     °336-722-9951      °       °[facility] °[address] °[place], [place] °336-723-1848 °90 day commitment/SA/Application process ° °[facility](men only)     °[address]     °[place], [place]     °336-748-1962       °Check-in at 7pm     °       °[facility] °[address] °[place], [place] [address] °336-248-6684 °Men/Women/Women and Children must be there by 7 pm ° °[facility] °[place], [place] °336-722-8721                ° °

## 2017-07-29 ENCOUNTER — Emergency Department (HOSPITAL_COMMUNITY)
Admission: EM | Admit: 2017-07-29 | Discharge: 2017-07-30 | Disposition: A | Discharge status: Home / Self Care | Payer: Self-pay | Attending: Emergency Medicine | Admitting: Emergency Medicine

## 2017-07-29 DIAGNOSIS — F17210 Nicotine dependence, cigarettes, uncomplicated: Secondary | ICD-10-CM | POA: Insufficient documentation

## 2017-07-29 DIAGNOSIS — F419 Anxiety disorder, unspecified: Secondary | ICD-10-CM | POA: Insufficient documentation

## 2017-07-29 DIAGNOSIS — F23 Brief psychotic disorder: Secondary | ICD-10-CM | POA: Diagnosis present

## 2017-07-29 DIAGNOSIS — R440 Auditory hallucinations: Secondary | ICD-10-CM | POA: Insufficient documentation

## 2017-07-29 DIAGNOSIS — R45851 Suicidal ideations: Secondary | ICD-10-CM | POA: Insufficient documentation

## 2017-07-29 DIAGNOSIS — Z046 Encounter for general psychiatric examination, requested by authority: Secondary | ICD-10-CM | POA: Insufficient documentation

## 2017-07-29 DIAGNOSIS — R443 Hallucinations, unspecified: Secondary | ICD-10-CM

## 2017-07-29 DIAGNOSIS — R079 Chest pain, unspecified: Secondary | ICD-10-CM

## 2017-07-29 LAB — RAPID URINE DRUG SCREEN, HOSP PERFORMED
AMPHETAMINES: NOT DETECTED
BARBITURATES: NOT DETECTED
Benzodiazepines: NOT DETECTED
Cocaine: NOT DETECTED
Opiates: NOT DETECTED
TETRAHYDROCANNABINOL: NOT DETECTED

## 2017-07-29 LAB — COMPREHENSIVE METABOLIC PANEL
ALBUMIN: 4.3 g/dL (ref 3.5–5.0)
ALT: 17 U/L (ref 17–63)
AST: 23 U/L (ref 15–41)
Alkaline Phosphatase: 51 U/L (ref 38–126)
Anion gap: 9 (ref 5–15)
BUN: 12 mg/dL (ref 6–20)
CHLORIDE: 102 mmol/L (ref 101–111)
CO2: 26 mmol/L (ref 22–32)
CREATININE: 1.14 mg/dL (ref 0.61–1.24)
Calcium: 9.4 mg/dL (ref 8.9–10.3)
GFR calc Af Amer: >60 mL/min (ref >60)
GLUCOSE: 110 mg/dL — AB (ref 65–99)
POTASSIUM: 4.1 mmol/L (ref 3.5–5.1)
Sodium: 137 mmol/L (ref 135–145)
Total Bilirubin: 1 mg/dL (ref 0.3–1.2)
Total Protein: 7.4 g/dL (ref 6.5–8.1)

## 2017-07-29 LAB — CBC WITH DIFFERENTIAL/PLATELET
BASOS ABS: 0 10*3/uL (ref 0.0–0.1)
BASOS PCT: 0 %
EOS PCT: 1 %
Eosinophils Absolute: 0.1 10*3/uL (ref 0.0–0.7)
HCT: 42.7 % (ref 39.0–52.0)
Hemoglobin: 14.3 g/dL (ref 13.0–17.0)
LYMPHS PCT: 23 %
Lymphs Abs: 1.9 10*3/uL (ref 0.7–4.0)
MCH: 29.4 pg (ref 26.0–34.0)
MCHC: 33.5 g/dL (ref 30.0–36.0)
MCV: 87.7 fL (ref 78.0–100.0)
Monocytes Absolute: 0.5 10*3/uL (ref 0.1–1.0)
Monocytes Relative: 5 %
Neutro Abs: 5.9 10*3/uL (ref 1.7–7.7)
Neutrophils Relative %: 71 %
PLATELETS: 190 10*3/uL (ref 150–400)
RBC: 4.87 MIL/uL (ref 4.22–5.81)
RDW: 12.9 % (ref 11.5–15.5)
WBC: 8.4 10*3/uL (ref 4.0–10.5)

## 2017-07-29 LAB — SALICYLATE LEVEL

## 2017-07-29 LAB — ETHANOL

## 2017-07-29 LAB — ACETAMINOPHEN LEVEL

## 2017-07-29 MED ORDER — LORAZEPAM 0.5 MG PO TABS
0.5000 mg | ORAL_TABLET | Freq: Once | ORAL | Status: AC
  Administered 2017-07-29: 0.5 mg via ORAL
  Filled 2017-07-29: qty 1
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
## 2017-07-29 NOTE — ED Notes (Signed)
EDPA Provider at bedside. 

## 2017-07-29 NOTE — ED Notes (Signed)
Pt stated "I was in prison for 8 years for selling drugs.  I quit doing drugs a long time ago.  [place] [facility] [address]."

## 2017-07-29 NOTE — ED Notes (Signed)
Bed: [id number] Expected date:  Expected time:  Means of arrival:  Comments: For voluntary with [employer]

## 2017-07-29 NOTE — ED Provider Notes (Signed)
[facility]-EMERGENCY DEPT Provider Note   CSN: [id number] Arrival date & time: 07/29/17  1648     History   Chief Complaint Chief Complaint  Patient presents with  . Medical Clearance  . Suicidal  . Hallucinations    HPI Chris Jackson is a 31 y.o. male.  The history is provided by the patient and medical records. No language interpreter was used.   Chris Jackson is a 31 y.o. male  with no known PMH who presents to the Emergency Department complaining of auditory hallucinations since he has gotten out of [facility] prison 4 days ago. Currently living in a half-way house. Patient states that he has voices that "nag" and "pick on him". He denies these voices telling him to do anything in particular during my evaluation, however nurse stated that he informed her that the voices were telling him to harm himself. No visual hallucinations. No homicidal thoughts. Patient states that he is currently not on any medications, but has been on a few medications for anxiousness off-and-on over the last several years. He does endorse drinking salt water to cleanse himself, but denies any other drug use to me.   No past medical history on file.  There are no active problems to display for this patient.   No past surgical history on file.     Home Medications    Prior to Admission medications   Not on File    Family History No family history on file.  Social History Social History   Tobacco Use  . Smoking status: Current Every Day Smoker    Packs/day: 0.25    Types: Cigarettes  . Smokeless tobacco: Never Used  Substance Use Topics  . Alcohol use: No    Frequency: Never  . Drug use: No     Allergies   Patient has no known allergies.   Review of Systems Review of Systems  Psychiatric/Behavioral: Positive for hallucinations.  All other systems reviewed and are negative.    Physical Exam Updated Vital Signs BP 140/85 (BP Location: Right Arm)   Pulse  94   Temp 98.7 F (37.1 C) (Oral)   Resp 18   SpO2 100%   Physical Exam  Constitutional: He is oriented to person, place, and time. He appears well-developed and well-nourished. No distress.  HENT:  Head: Normocephalic and atraumatic.  Cardiovascular: Normal rate, regular rhythm and normal heart sounds.  No murmur heard. Pulmonary/Chest: Effort normal and breath sounds normal. No respiratory distress. He has no wheezes. He has no rales.  Abdominal: Soft. He exhibits no distension. There is no tenderness.  Musculoskeletal: He exhibits no edema.  Neurological: He is alert and oriented to person, place, and time.  Skin: Skin is warm and dry.  Nursing note and vitals reviewed.    ED Treatments / Results  Labs (all labs ordered are listed, but only abnormal results are displayed) Labs Reviewed  COMPREHENSIVE METABOLIC PANEL - Abnormal; Notable for the following components:      Result Value   Glucose, Bld 110 (*)    All other components within normal limits  ACETAMINOPHEN LEVEL - Abnormal; Notable for the following components:   Acetaminophen (Tylenol), Serum <10 (*)    All other components within normal limits  CBC WITH DIFFERENTIAL/PLATELET  RAPID URINE DRUG SCREEN, HOSP PERFORMED  ETHANOL  SALICYLATE LEVEL    EKG  EKG Interpretation None       Radiology No results found.  Procedures Procedures (including critical care time)  Medications Ordered in ED Medications  LORazepam (ATIVAN) tablet 0.5 mg (0.5 mg Oral Given 07/29/17 1938)     Initial Impression / Assessment and Plan / ED Course  I have reviewed the triage vital signs and the nursing notes.  Pertinent labs & imaging results that were available during my care of the patient were reviewed by me and considered in my medical decision making (see chart for details).    Chris Jackson is a 31 y.o. male who presents to ED for anxiousness and auditory hallucinations.  Labs reviewed and reassuring.  Medically  cleared with dispo per psychiatry recommendations.   Final Clinical Impressions(s) / ED Diagnoses   Final diagnoses:  Hallucinations    ED Discharge Orders    None       Ward, Chase PicketJaime Pilcher, PA-C 07/29/17 2004    Charlynne PanderYao, David Hsienta, MD 07/29/17 [phone]

## 2017-07-29 NOTE — ED Notes (Signed)
Mom called to speak to pt.; pt. given the wireless phone.  Eloisa Northernngela McDonald (Mom) [phone]

## 2017-07-29 NOTE — ED Notes (Signed)
PT REPORTS TAKING "SALT AND WATER" "BATHS SALTS" TODAY TO CLEANSE HIS BODY FOR THIS SPIRIT. EDPA JAMIE MADE AWARE

## 2017-07-29 NOTE — ED Triage Notes (Signed)
Pt just out of federal prison 4 days. . Sentenced 8 years. Currently in half -way house. Pt reports hearing voices to harming self Pt denies having this trouble in prison.

## 2017-07-30 ENCOUNTER — Encounter (HOSPITAL_COMMUNITY): Payer: Self-pay | Admitting: Emergency Medicine

## 2017-07-30 ENCOUNTER — Emergency Department (HOSPITAL_COMMUNITY): Payer: Self-pay

## 2017-07-30 DIAGNOSIS — F17210 Nicotine dependence, cigarettes, uncomplicated: Secondary | ICD-10-CM

## 2017-07-30 DIAGNOSIS — F23 Brief psychotic disorder: Secondary | ICD-10-CM

## 2017-07-30 DIAGNOSIS — R443 Hallucinations, unspecified: Secondary | ICD-10-CM

## 2017-07-30 LAB — TROPONIN I: Troponin I: <0.03 ng/mL (ref <0.03)

## 2017-07-30 LAB — LIPASE, BLOOD: LIPASE: 40 U/L (ref 11–51)

## 2017-07-30 MED ORDER — IBUPROFEN 800 MG PO TABS
800.0000 mg | ORAL_TABLET | Freq: Once | ORAL | Status: AC
  Administered 2017-07-30: 800 mg via ORAL
  Filled 2017-07-30: qty 1

## 2017-07-30 MED ORDER — QUETIAPINE FUMARATE 25 MG PO TABS
25.0000 mg | ORAL_TABLET | Freq: Two times a day (BID) | ORAL | Status: DC
  Administered 2017-07-30: 25 mg via ORAL
  Filled 2017-07-30: qty 1

## 2017-07-30 MED ORDER — RISPERIDONE 0.5 MG PO TABS
0.5000 mg | ORAL_TABLET | Freq: Two times a day (BID) | ORAL | Status: DC
  Administered 2017-07-30: 0.5 mg via ORAL
  Filled 2017-07-30: qty 1

## 2017-07-30 NOTE — [facility] Suicide Risk Assessment (Signed)
Suicide Risk Assessment  Discharge Assessment   [facility]   Principal Problem: Acute psychosis [facility](HCC) Discharge Diagnoses:  Patient Active Problem List   Diagnosis Date Noted  . Acute psychosis (HCC) [F23] 07/30/2017    Priority: High    Total Time spent with patient: 45 minutes  Musculoskeletal: Strength & Muscle Tone: within normal limits Gait & Station: normal Patient leans: N/A  Psychiatric Specialty Exam: Physical Exam  Constitutional: He is oriented to person, place, and time. He appears well-developed and well-nourished.  HENT:  Head: Normocephalic.  Neck: Normal range of motion.  Respiratory: Effort normal.  Musculoskeletal: Normal range of motion.  Neurological: He is alert and oriented to person, place, and time.  Psychiatric: He has a normal mood and affect. His speech is normal. Judgment and thought content normal. He is actively hallucinating. Cognition and memory are normal.    Review of Systems  Psychiatric/Behavioral: Positive for hallucinations.  All other systems reviewed and are negative.   Blood pressure 114/70, pulse 89, temperature 97.6 F (36.4 C), temperature source Oral, resp. rate 18, SpO2 98 %.There is no height or weight on file to calculate BMI.  General Appearance: Casual  Eye Contact:  Good  Speech:  Normal Rate  Volume:  Normal  Mood:  Euthymic  Affect:  Congruent  Thought Process:  Coherent and Descriptions of Associations: Intact  Orientation:  Full (Time, Place, and Person)  Thought Content:  WDL and Logical  Suicidal Thoughts:  No  Homicidal Thoughts:  No  Memory:  Immediate;   Good Recent;   Good Remote;   Good  Judgement:  Fair  Insight:  Good  Psychomotor Activity:  Normal  Concentration:  Concentration: Good and Attention Span: Good  Recall:  Good  Fund of Knowledge:  Fair  Language:  Good  Akathisia:  No  Handed:  Right  AIMS (if indicated):     Assets:  Leisure Time Physical  Health Resilience  ADL's:  Intact  Cognition:  WNL  Sleep:       Mental Status Per Nursing Assessment::   On Admission:   hallucinations  Demographic Factors:  Male, Adolescent or young adult and Living alone  Loss Factors: NA  Historical Factors: NA  Risk Reduction Factors:   Sense of responsibility to family  Continued Clinical Symptoms:  Hallucinations, reduced  Cognitive Features That Contribute To Risk:  None    Suicide Risk:  Minimal: No identifiable suicidal ideation.  Patients presenting with no risk factors but with morbid ruminations; may be classified as minimal risk based on the severity of the depressive symptoms    Plan Of Care/Follow-up recommendations:  Activity:  as tolerated Diet:  heart healthy diet  Pascuala Klutts, NP 07/30/2017, 6:45 PM

## 2017-07-30 NOTE — BH Assessment (Signed)
[facility] Assessment Progress Note  Per Jacqueline Norman, DO, this pt does not require psychiatric hospitalization at this time.  Pt is to be discharged from [facility] with recommendation to follow up with [facility].  This has been included in pt's discharge instructions.  Pt's nurse, Cynthia, has been notified.  Bernadette Gores, MA Triage Specialist [phone]     

## 2017-07-30 NOTE — Discharge Instructions (Addendum)
For your mental health needs, you are advised to follow up with [facility].  New and returning patients are seen at their walk-in clinic.  Walk-in hours are Monday - Friday from 8:00 am - 3:00 pm.  Walk-in patients are seen on a first come, first served basis.  Try to arrive as early as possible for he best chance of being seen the same day:       [facility]      [address]      [place], [place] [address]   Additionally, EKG, chest x-ray, labs showed no life-threatening condition.  Try to find a primary care doctor.

## 2017-07-30 NOTE — ED Notes (Signed)
Patient denies SI/HI/VH. Patient reports AH "sometimes they are clear and but right now they are not.". Plan of care discussed and meal given. Encouragement and support provided and safety maintain. Q 15 min safety checks in place and video monitoring.

## 2017-07-30 NOTE — ED Notes (Signed)
Patient reported to nurse that he wants to leave unit.  He started crying and said he was trying to remain calm, but he felt closed in.  Nurse replied that she would notify the doctor and NP that he wanted to leave.  Patient accepted this answer and remained calm and cooperative.

## 2017-07-30 NOTE — Progress Notes (Signed)
Asked by RN to evaluate patient.  Patient complains of lower chest and upper abdominal pain.  Physical exam was nonfocal.  EKG shows nonspecific ST elevation consistent with early repolarization.  Labs including hepatic functions, lipase, troponin all negative.  Patient is low risk for ACS or PE.  Will discharge home.

## 2017-07-30 NOTE — ED Notes (Signed)
Dr. Adriana Simasook notified of patient's pain.  Patient's abdominal pain comes and goes and he feels like it is a "knot twisting in his stomach."

## 2017-07-30 NOTE — Consult Note (Signed)
[facility] Face-to-Face Psychiatry Consult   Reason for Consult:  Hallucinations  Referring Physician:  EDP Patient Identification: Chris Jackson MRN:  [id number] Principal Diagnosis:  Acute psychosis ([place]) Diagnosis:  Psychosis  Total Time spent with patient: 45 minutes  Subjective:   Chris Jackson is a 31 y.o. male patient does not warrant admission.  HPI:  31 yo male who presented to the ED with hallucinations, auditory.  No substance abuse but past history of one.  No suicidal/homicidal ideations, denies command hallucinations. He wants medications but wants to leave as this closed space reminds him of prison.  Requested he stay until we could observe for any negative side effects of the Risperdal that was started.  Stable for discharge after no side effects with referral to [facility].  Past Psychiatric History: hallucinations over the past few months  Risk to Self: Suicidal Ideation: No Suicidal Intent: No Is patient at risk for suicide?: No Suicidal Plan?: No Access to Means: No What has been your use of drugs/alcohol within the last 12 months?: Pt denies How many times?: 0 Other Self Harm Risks: None Triggers for Past Attempts: None known Intentional Self Injurious Behavior: None Risk to Others: Homicidal Ideation: No Thoughts of Harm to Others: No Current Homicidal Intent: No Current Homicidal Plan: No Access to Homicidal Means: No Identified Victim: No one History of harm to others?: No Assessment of Violence: None Noted Violent Behavior Description: (Pt denies.) Does patient have access to weapons?: No Criminal Charges Pending?: No Does patient have a court date: No Prior Inpatient Therapy: Prior Inpatient Therapy: No Prior Therapy Dates: Unknown Prior Therapy Facilty/Provider(s): Pt does not remember Reason for Treatment: Unknown Prior Outpatient Therapy: Prior Outpatient Therapy: No Prior Therapy Dates: None Prior Therapy Facilty/Provider(s): None Reason for Treatment:  None Does patient have an ACCT team?: No Does patient have Intensive In-House Services?  : No Does patient have [facility] services? : No Does patient have [facility] services?: No  Past Medical History: History reviewed. No pertinent past medical history. History reviewed. No pertinent surgical history. Family History: History reviewed. No pertinent family history. Family Psychiatric  History: none  Social History:  Social History   Substance and Sexual Activity  Alcohol Use No  . Frequency: Never     Social History   Substance and Sexual Activity  Drug Use No    Social History   Socioeconomic History  . Marital status: Single    Spouse name: None  . Number of children: None  . Years of education: None  . Highest education level: None  Social Needs  . Financial resource strain: None  . Food insecurity - worry: None  . Food insecurity - inability: None  . Transportation needs - medical: None  . Transportation needs - non-medical: None  Occupational History  . None  Tobacco Use  . Smoking status: Current Every Day Smoker    Packs/day: 0.25    Types: Cigarettes  . Smokeless tobacco: Never Used  Substance and Sexual Activity  . Alcohol use: No    Frequency: Never  . Drug use: No  . Sexual activity: None  Other Topics Concern  . None  Social History Narrative  . None   Additional Social History: N/A    Allergies:  No Known Allergies  Labs:  Results for orders placed or performed during the hospital encounter of 07/29/17 (from the past 48 hour(s))  Urine rapid drug screen (hosp performed)     Status: None   Collection Time: 07/29/17  6:06 PM  Result Value Ref Range   Opiates NONE DETECTED NONE DETECTED   Cocaine NONE DETECTED NONE DETECTED   Benzodiazepines NONE DETECTED NONE DETECTED   Amphetamines NONE DETECTED NONE DETECTED   Tetrahydrocannabinol NONE DETECTED NONE DETECTED   Barbiturates NONE DETECTED NONE DETECTED    Comment:        DRUG SCREEN FOR MEDICAL  PURPOSES ONLY.  IF CONFIRMATION IS NEEDED FOR ANY PURPOSE, NOTIFY LAB WITHIN 5 DAYS.        LOWEST DETECTABLE LIMITS FOR URINE DRUG SCREEN Drug Class       Cutoff (ng/mL) Amphetamine      1000 Barbiturate      200 Benzodiazepine   563 Tricyclics       893 Opiates          300 Cocaine          300 THC              50   CBC with Differential     Status: None   Collection Time: 07/29/17  6:50 PM  Result Value Ref Range   WBC 8.4 4.0 - 10.5 K/uL   RBC 4.87 4.22 - 5.81 MIL/uL   Hemoglobin 14.3 13.0 - 17.0 g/dL   HCT 42.7 39.0 - 52.0 %   MCV 87.7 78.0 - 100.0 fL   MCH 29.4 26.0 - 34.0 pg   MCHC 33.5 30.0 - 36.0 g/dL   RDW 12.9 11.5 - 15.5 %   Platelets 190 150 - 400 K/uL   Neutrophils Relative % 71 %   Neutro Abs 5.9 1.7 - 7.7 K/uL   Lymphocytes Relative 23 %   Lymphs Abs 1.9 0.7 - 4.0 K/uL   Monocytes Relative 5 %   Monocytes Absolute 0.5 0.1 - 1.0 K/uL   Eosinophils Relative 1 %   Eosinophils Absolute 0.1 0.0 - 0.7 K/uL   Basophils Relative 0 %   Basophils Absolute 0.0 0.0 - 0.1 K/uL  Comprehensive metabolic panel     Status: Abnormal   Collection Time: 07/29/17  6:50 PM  Result Value Ref Range   Sodium 137 135 - 145 mmol/L   Potassium 4.1 3.5 - 5.1 mmol/L   Chloride 102 101 - 111 mmol/L   CO2 26 22 - 32 mmol/L   Glucose, Bld 110 (H) 65 - 99 mg/dL   BUN 12 6 - 20 mg/dL   Creatinine, Ser 1.14 0.61 - 1.24 mg/dL   Calcium 9.4 8.9 - 10.3 mg/dL   Total Protein 7.4 6.5 - 8.1 g/dL   Albumin 4.3 3.5 - 5.0 g/dL   AST 23 15 - 41 U/L   ALT 17 17 - 63 U/L   Alkaline Phosphatase 51 38 - 126 U/L   Total Bilirubin 1.0 0.3 - 1.2 mg/dL   GFR calc non Af Amer >60 >60 mL/min   GFR calc Af Amer >60 >60 mL/min    Comment: (NOTE) The eGFR has been calculated using the CKD EPI equation. This calculation has not been validated in all clinical situations. eGFR's persistently <60 mL/min signify possible Chronic Kidney Disease.    Anion gap 9 5 - 15  Ethanol     Status: None    Collection Time: 07/29/17  6:50 PM  Result Value Ref Range   Alcohol, Ethyl (B) <10 <10 mg/dL    Comment:        LOWEST DETECTABLE LIMIT FOR SERUM ALCOHOL IS 10 mg/dL FOR MEDICAL PURPOSES ONLY   Salicylate level  Status: None   Collection Time: 07/29/17  6:50 PM  Result Value Ref Range   Salicylate Lvl <2.4 2.8 - 30.0 mg/dL  Acetaminophen level     Status: Abnormal   Collection Time: 07/29/17  6:50 PM  Result Value Ref Range   Acetaminophen (Tylenol), Serum <10 (L) 10 - 30 ug/mL    Comment:        THERAPEUTIC CONCENTRATIONS VARY SIGNIFICANTLY. A RANGE OF 10-30 ug/mL MAY BE AN EFFECTIVE CONCENTRATION FOR MANY PATIENTS. HOWEVER, SOME ARE BEST TREATED AT CONCENTRATIONS OUTSIDE THIS RANGE. ACETAMINOPHEN CONCENTRATIONS >150 ug/mL AT 4 HOURS AFTER INGESTION AND >50 ug/mL AT 12 HOURS AFTER INGESTION ARE OFTEN ASSOCIATED WITH TOXIC REACTIONS.     Current Facility-Administered Medications  Medication Dose Route Frequency Provider Last Rate Last Dose  . risperiDONE (RISPERDAL) tablet 0.5 mg  0.5 mg Oral BID Patrecia Pour, NP       No current outpatient medications on file.    Musculoskeletal: Strength & Muscle Tone: within normal limits Gait & Station: normal Patient leans: N/A  Psychiatric Specialty Exam: Physical Exam  Nursing note and vitals reviewed. Constitutional: He is oriented to person, place, and time. He appears well-developed and well-nourished.  HENT:  Head: Normocephalic.  Neck: Normal range of motion.  Respiratory: Effort normal.  Musculoskeletal: Normal range of motion.  Neurological: He is alert and oriented to person, place, and time.  Psychiatric: He has a normal mood and affect. His speech is normal. Judgment and thought content normal. He is actively hallucinating. Cognition and memory are normal.    Review of Systems  Psychiatric/Behavioral: Positive for hallucinations.  All other systems reviewed and are negative.   Blood pressure 114/70,  pulse 89, temperature 97.6 F (36.4 C), temperature source Oral, resp. rate 18, SpO2 98 %.There is no height or weight on file to calculate BMI.  General Appearance: Casual  Eye Contact:  Good  Speech:  Normal Rate  Volume:  Normal  Mood:  Euthymic  Affect:  Congruent  Thought Process:  Coherent and Descriptions of Associations: Intact  Orientation:  Full (Time, Place, and Person)  Thought Content:  WDL and Logical  Suicidal Thoughts:  No  Homicidal Thoughts:  No  Memory:  Immediate;   Good Recent;   Good Remote;   Good  Judgement:  Fair  Insight:  Good  Psychomotor Activity:  Normal  Concentration:  Concentration: Good and Attention Span: Good  Recall:  Good  Fund of Knowledge:  Fair  Language:  Good  Akathisia:  No  Handed:  Right  AIMS (if indicated):   N/A  Assets:  Leisure Time Physical Health Resilience  ADL's:  Intact  Cognition:  WNL  Sleep:   N/A     Treatment Plan Summary: Daily contact with patient to assess and evaluate symptoms and progress in treatment, Medication management and Plan acute psychosis:  -Crisis stabilization -Medication management:  Started Risperdal 0.5 mg BID for psychosis -Individual counseling  Disposition: No evidence of imminent risk to self or others at present.    Waylan Boga, NP 07/30/2017 11:01 AM   Patient seen face-to-face for psychiatric evaluation, chart reviewed and case discussed with the physician extender and developed treatment plan. Reviewed the information documented and agree with the treatment plan.  Buford Dresser, DO

## 2017-08-01 ENCOUNTER — Emergency Department (HOSPITAL_COMMUNITY)
Admission: EM | Admit: 2017-08-01 | Discharge: 2017-08-01 | Disposition: A | Discharge status: Home / Self Care | Payer: Self-pay | Attending: Emergency Medicine | Admitting: Emergency Medicine

## 2017-08-01 ENCOUNTER — Encounter (HOSPITAL_COMMUNITY): Payer: Self-pay | Admitting: Emergency Medicine

## 2017-08-01 DIAGNOSIS — F17210 Nicotine dependence, cigarettes, uncomplicated: Secondary | ICD-10-CM | POA: Insufficient documentation

## 2017-08-01 DIAGNOSIS — R440 Auditory hallucinations: Secondary | ICD-10-CM | POA: Insufficient documentation

## 2017-08-01 DIAGNOSIS — Z760 Encounter for issue of repeat prescription: Secondary | ICD-10-CM

## 2017-08-01 MED ORDER — RISPERIDONE 0.5 MG PO TABS: 0.5000 mg | ORAL_TABLET | Freq: Two times a day (BID) | ORAL | 0 refills | Status: AC

## 2017-08-01 NOTE — ED Provider Notes (Signed)
[facility]-EMERGENCY DEPT Provider Note   CSN: [id number] Arrival date & time: 08/01/17  1231     History   Chief Complaint Chief Complaint  Patient presents with  . Hallucinations  . wants medication    HPI Chris Jackson is a 31 y.o. male who presents to the emergency department requesting medication refill today.  He was seen in the emergency department 07/29/17 with behavioral health evaluation for auditory hallucinations and diagnosis of acute psychosis.  Patient was seen by NP Nanine MeansJamison Lord and was started on 0.5 mg Risperdal twice daily.  The patient was offered a bed, and declined.  He stayed following his first dose of Risperdal to ensure no adverse reactions and then requested to leave.  He was provided a prescription for Risperdal at that time, patient reports he does not have the prescription.  Has not had his medicine.  Requesting a refill of the Risperdal.  Denies any SI, HI, hallucinations, headache, chest pain, dyspnea, or abdominal pain.  HPI  History reviewed. No pertinent past medical history.  Patient Active Problem List   Diagnosis Date Noted  . Acute psychosis (HCC) 07/30/2017    History reviewed. No pertinent surgical history.     Home Medications    Prior to Admission medications   Medication Sig Start Date End Date Taking? Authorizing Provider  risperiDONE (RISPERDAL) 0.5 MG tablet Take 1 tablet (0.5 mg total) by mouth 2 (two) times daily. 08/01/17   Cannan Beeck, Pleas KochSamantha R, PA-C    Family History History reviewed. No pertinent family history.  Social History Social History   Tobacco Use  . Smoking status: Current Every Day Smoker    Packs/day: 0.25    Types: Cigarettes  . Smokeless tobacco: Never Used  Substance Use Topics  . Alcohol use: No    Frequency: Never  . Drug use: No     Allergies   Patient has no known allergies.   Review of Systems Review of Systems  Constitutional: Negative for chills and fever.    Respiratory: Negative for shortness of breath.   Cardiovascular: Negative for chest pain.  Gastrointestinal: Negative for abdominal pain.  Neurological: Negative for headaches.  Psychiatric/Behavioral: Negative for hallucinations and suicidal ideas.     Physical Exam Updated Vital Signs BP (!) 141/79 (BP Location: Left Arm)   Pulse 79   Temp 98.7 F (37.1 C) (Oral)   Resp 16   SpO2 97%   Physical Exam  Constitutional: He appears well-developed and well-nourished. No distress.  HENT:  Head: Normocephalic and atraumatic.  Eyes: Conjunctivae are normal. Right eye exhibits no discharge. Left eye exhibits no discharge.  Cardiovascular: Normal rate and regular rhythm.  Pulmonary/Chest: Effort normal and breath sounds normal. No respiratory distress.  Neurological: He is alert.  Clear speech.   Psychiatric: His speech is normal and behavior is normal. He expresses no homicidal and no suicidal ideation. He expresses no suicidal plans and no homicidal plans.  Mood and Affect: Somewhat irritated.   Nursing note and vitals reviewed.    ED Treatments / Results  Labs (all labs ordered are listed, but only abnormal results are displayed) Labs Reviewed - No data to display  EKG  EKG Interpretation None       Radiology No results found.  Procedures Procedures (including critical care time)  Medications Ordered in ED Medications - No data to display   Initial Impression / Assessment and Plan / ED Course  I have reviewed the triage vital signs and  the nursing notes.  Pertinent labs & imaging results that were available during my care of the patient were reviewed by me and considered in my medical decision making (see chart for details).   Patient presents to the emergency department requesting Risperdal 0.5 mg twice daily prescription refill following behavioral health evaluation earlier in the week.  He was started on this medication 12/13 and given prescription, but does  not have it-unclear history given patient states he never received the prescription.  [facility] but there was some difficulty with this as well.  Patient reported hallucinations to triage RN, however denied these to me.  Patient is nontoxic-appearing with stable vital signs.  No complaints other than requesting medication refill.  Has denied SI/HI on several occassions during visit today.   I discussed patient's request and H&P with psychiatry NP Nanine MeansJamison Lord who is in agreement with me refilling patient's Risperdal for 30 days.   Discussed with patient that I will refill the medication for 30 days.  Also discussed that if he wishes to stay or return for further behavioral health stay he absolutely may do so. Discussed need for behavioral health follow-up and return precautions with the patient. Provided opportunity for questions, patient confirmed understanding and is in agreement with plan for DC home with med refill.   Final Clinical Impressions(s) / ED Diagnoses   Final diagnoses:  Encounter for medication refill    ED Discharge Orders        Ordered    risperiDONE (RISPERDAL) 0.5 MG tablet  2 times daily     08/01/17 [address]21       Alexzavier Girardin R, PA-C 08/01/17 1341    Loren RacerYelverton, David, MD 08/02/17 [phone]

## 2017-08-01 NOTE — ED Notes (Signed)
Bed: WLPT3 Expected date:  Expected time:  Means of arrival:  Comments: 

## 2017-08-01 NOTE — Discharge Instructions (Signed)
You were seen in the emergency department requesting refill of your medicine.  I have refilled a 30-day supply of your Risperdal.  You will need to follow-up with a psychiatrist for further management of this medication.   Return to the emergency department for any new or worsening symptoms including but not limited to thoughts of harming yourself or others, hallucinations, difficulty with muscle movement/stiffness, palpitations, or chest pain.

## 2017-08-01 NOTE — ED Triage Notes (Signed)
Per EMS- [facility] called to [facility] with c/o resident with auditory hallucinations. [facility] and [facility]. Pt was not started aon any medications. Currently agitated and "hearing voices", "receiving messages telepathically. Pt requesting to have a cigarette to calm himself.Pt is is frustrated due lack of treatment or medication. Denies HI/SI

## 2017-08-05 ENCOUNTER — Other Ambulatory Visit: Payer: Self-pay

## 2017-08-05 ENCOUNTER — Emergency Department (HOSPITAL_COMMUNITY): Payer: PRIVATE HEALTH INSURANCE

## 2017-08-05 ENCOUNTER — Encounter (HOSPITAL_COMMUNITY): Payer: Self-pay | Admitting: Emergency Medicine

## 2017-08-05 ENCOUNTER — Emergency Department (HOSPITAL_COMMUNITY)
Admission: EM | Admit: 2017-08-05 | Discharge: 2017-08-05 | Disposition: A | Discharge status: Home / Self Care | Payer: PRIVATE HEALTH INSURANCE | Attending: Emergency Medicine | Admitting: Emergency Medicine

## 2017-08-05 DIAGNOSIS — R1011 Right upper quadrant pain: Secondary | ICD-10-CM | POA: Insufficient documentation

## 2017-08-05 DIAGNOSIS — F17210 Nicotine dependence, cigarettes, uncomplicated: Secondary | ICD-10-CM | POA: Insufficient documentation

## 2017-08-05 DIAGNOSIS — Z79899 Other long term (current) drug therapy: Secondary | ICD-10-CM | POA: Diagnosis not present

## 2017-08-05 DIAGNOSIS — R1084 Generalized abdominal pain: Secondary | ICD-10-CM

## 2017-08-05 DIAGNOSIS — K5909 Other constipation: Secondary | ICD-10-CM

## 2017-08-05 LAB — COMPREHENSIVE METABOLIC PANEL
ALBUMIN: 4.3 g/dL (ref 3.5–5.0)
ALT: 12 U/L — ABNORMAL LOW (ref 17–63)
AST: 23 U/L (ref 15–41)
Alkaline Phosphatase: 49 U/L (ref 38–126)
Anion gap: 11 (ref 5–15)
BILIRUBIN TOTAL: 0.5 mg/dL (ref 0.3–1.2)
BUN: 13 mg/dL (ref 6–20)
CHLORIDE: 102 mmol/L (ref 101–111)
CO2: 25 mmol/L (ref 22–32)
Calcium: 9.3 mg/dL (ref 8.9–10.3)
Creatinine, Ser: 1.14 mg/dL (ref 0.61–1.24)
GFR calc Af Amer: >60 mL/min (ref >60)
GFR calc non Af Amer: >60 mL/min (ref >60)
GLUCOSE: 96 mg/dL (ref 65–99)
POTASSIUM: 3.3 mmol/L — AB (ref 3.5–5.1)
Sodium: 138 mmol/L (ref 135–145)
Total Protein: 7.4 g/dL (ref 6.5–8.1)

## 2017-08-05 LAB — URINALYSIS, ROUTINE W REFLEX MICROSCOPIC
BILIRUBIN URINE: NEGATIVE
GLUCOSE, UA: NEGATIVE mg/dL
Hgb urine dipstick: NEGATIVE
KETONES UR: NEGATIVE mg/dL
Leukocytes, UA: NEGATIVE
Nitrite: NEGATIVE
PH: 6 (ref 5.0–8.0)
Protein, ur: NEGATIVE mg/dL
Specific Gravity, Urine: 1.014 (ref 1.005–1.030)

## 2017-08-05 LAB — CBC
HCT: 39.3 % (ref 39.0–52.0)
Hemoglobin: 13.3 g/dL (ref 13.0–17.0)
MCH: 29.2 pg (ref 26.0–34.0)
MCHC: 33.8 g/dL (ref 30.0–36.0)
MCV: 86.4 fL (ref 78.0–100.0)
Platelets: 182 10*3/uL (ref 150–400)
RBC: 4.55 MIL/uL (ref 4.22–5.81)
RDW: 12.6 % (ref 11.5–15.5)
WBC: 8.4 10*3/uL (ref 4.0–10.5)

## 2017-08-05 LAB — LIPASE, BLOOD: LIPASE: 40 U/L (ref 11–51)

## 2017-08-05 MED ORDER — ONDANSETRON HCL 4 MG/2ML IJ SOLN
4.0000 mg | Freq: Once | INTRAMUSCULAR | Status: AC
  Administered 2017-08-05: 4 mg via INTRAVENOUS

## 2017-08-05 MED ORDER — IOPAMIDOL (ISOVUE-300) INJECTION 61%
100.0000 mL | Freq: Once | INTRAVENOUS | Status: AC | PRN
  Administered 2017-08-05: 100 mL via INTRAVENOUS

## 2017-08-05 MED ORDER — SODIUM CHLORIDE 0.9 % IV BOLUS (SEPSIS)
1000.0000 mL | Freq: Once | INTRAVENOUS | Status: AC
  Administered 2017-08-05: 1000 mL via INTRAVENOUS

## 2017-08-05 MED ORDER — PROMETHAZINE HCL 25 MG PO TABS: 25.0000 mg | ORAL_TABLET | Freq: Four times a day (QID) | ORAL | 0 refills | Status: AC | PRN

## 2017-08-05 MED ORDER — DOCUSATE SODIUM 100 MG PO CAPS: 100.0000 mg | ORAL_CAPSULE | Freq: Two times a day (BID) | ORAL | 0 refills | Status: AC

## 2017-08-05 MED ORDER — IOPAMIDOL (ISOVUE-300) INJECTION 61%
INTRAVENOUS | Status: AC
  Administered 2017-08-05: 100 mL via INTRAVENOUS
  Filled 2017-08-05: qty 100

## 2017-08-05 MED ORDER — KETOROLAC TROMETHAMINE 30 MG/ML IJ SOLN
30.0000 mg | Freq: Once | INTRAMUSCULAR | Status: AC
  Administered 2017-08-05: 30 mg via INTRAVENOUS
  Filled 2017-08-05: qty 1

## 2017-08-05 MED ORDER — ONDANSETRON HCL 4 MG/2ML IJ SOLN
4.0000 mg | Freq: Once | INTRAMUSCULAR | Status: DC | PRN
  Filled 2017-08-05: qty 2

## 2017-08-05 MED ORDER — DICYCLOMINE HCL 20 MG PO TABS: 20.0000 mg | ORAL_TABLET | Freq: Three times a day (TID) | ORAL | 0 refills | Status: AC
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
## 2017-08-05 NOTE — ED Notes (Signed)
The halfway house the patient lives in is going to send a cab to pick him up at 8am

## 2017-08-05 NOTE — ED Notes (Signed)
Bed: WA20 Expected date:  Expected time:  Means of arrival:  Comments: 31 yr old abdominal pain

## 2017-08-05 NOTE — Discharge Instructions (Signed)
You may alternate Tylenol 1000 mg every 6 hours as needed for pain and Ibuprofen 800 mg every 8 hours as needed for pain.  Please take Ibuprofen with food. ° ° °I recommend that you increase your water and fiber intake. If you are not able to eat foods high in fiber, you may use Benefiber or Metamucil over-the-counter. I also recommend you use MiraLAX 1-2 times a day and Colace 100 mg twice a day to help with bowel movements. These medications are over the counter.  You may use other over-the-counter medications such as Dulcolax, Fleet enemas, magnesium citrate as needed for constipation. Please note that some of these medications may cause you to have abdominal cramping which is normal. If you develop severe abdominal pain, fever, vomiting, distention of your abdomen, unable to have a bowel movement for 5 days or are not passing gas, please return to the hospital. ° °

## 2017-08-05 NOTE — ED Triage Notes (Signed)
Pt BIB EMS from [facility] for sudden onset mid abdominal pain. Patient states he suddenly felt a "pop" and began having pain. No pain on palpation. Nausea, no vomiting or diarrhea. Hx of stomach issues per patient.

## 2017-08-05 NOTE — ED Provider Notes (Signed)
TIME SEEN: 1:02 AM  CHIEF COMPLAINT: Abdominal pain  HPI: Patient is a 31 year old male with psychiatric history who is currently living at a halfway house who presents to the emergency department abdominal pain that started tonight.  States he felt a pop in the right upper abdomen tonight.  Reports that he had a bowel movement yesterday that was normal.  No blood or melena.  Denies fevers, chills, vomiting or diarrhea.  Has had nausea.  No previous abdominal surgeries.  No dysuria or hematuria.  States that he was at rest when the symptoms started.  No injury to the abdomen.  Denies drug or alcohol use.  ROS: See HPI Constitutional: no fever  Eyes: no drainage  ENT: no runny nose   Cardiovascular:  no chest pain  Resp: no SOB  GI: no vomiting GU: no dysuria Integumentary: no rash  Allergy: no hives  Musculoskeletal: no leg swelling  Neurological: no slurred speech ROS otherwise negative  PAST MEDICAL HISTORY/PAST SURGICAL HISTORY:  History reviewed. No pertinent past medical history.  MEDICATIONS:  Prior to Admission medications   Medication Sig Start Date End Date Taking? Authorizing Provider  risperiDONE (RISPERDAL) 0.5 MG tablet Take 1 tablet (0.5 mg total) by mouth 2 (two) times daily. 08/01/17   Petrucelli, Pleas KochSamantha R, PA-C    ALLERGIES:  No Known Allergies  SOCIAL HISTORY:  Social History   Tobacco Use  . Smoking status: Current Every Day Smoker    Packs/day: 0.25    Types: Cigarettes  . Smokeless tobacco: Never Used  Substance Use Topics  . Alcohol use: No    Frequency: Never    FAMILY HISTORY: History reviewed. No pertinent family history.  EXAM: BP (!) 154/86 (BP Location: Right Arm)   Pulse 85   Temp 98.2 F (36.8 C) (Oral)   Resp (!) 32   SpO2 97%  CONSTITUTIONAL: Alert and oriented and responds appropriately to questions.  Appears uncomfortable, tearful HEAD: Normocephalic EYES: Conjunctivae clear, pupils appear equal, EOMI ENT: normal nose;  moist mucous membranes NECK: Supple, no meningismus, no nuchal rigidity, no LAD  CARD: RRR; S1 and S2 appreciated; no murmurs, no clicks, no rubs, no gallops RESP: Normal chest excursion without splinting or tachypnea; breath sounds clear and equal bilaterally; no wheezes, no rhonchi, no rales, no hypoxia or respiratory distress, speaking full sentences ABD/GI: Normal bowel sounds; non-distended; soft, mild tender to palpation throughout the abdomen, no rebound, no guarding, no peritoneal signs, no hepatosplenomegaly BACK:  The back appears normal and is non-tender to palpation, there is no CVA tenderness EXT: Normal ROM in all joints; non-tender to palpation; no edema; normal capillary refill; no cyanosis, no calf tenderness or swelling    SKIN: Normal color for age and race; warm; no rash NEURO: Moves all extremities equally PSYCH: The patient's mood and manner are appropriate. Grooming and personal hygiene are appropriate.  MEDICAL DECISION MAKING: Patient here with complaints of abdominal pain.  Differential includes appendicitis, bowel obstruction, colitis, diverticulitis, cholelithiasis or cholecystitis.  Will obtain labs, urine and a CT of his abdomen pelvis.  Will give Toradol, Zofran, IV fluids and reassess.  ED PROGRESS: Patient's labs, urine, CT scan unremarkable.  He does have mild wall thickening of the rectum could be from recent constipation.  Patient does report recent constipation but states he did have a bowel movement yesterday.  No sign of fecal impaction.  I feel he is safe to be discharged.  Will discharge with prescriptions of Phenergan and Bentyl.  At  this time, I do not feel there is any life-threatening condition present. I have reviewed and discussed all results (EKG, imaging, lab, urine as appropriate) and exam findings with patient/family. I have reviewed nursing notes and appropriate previous records.  I feel the patient is safe to be discharged home without further  emergent workup and can continue workup as an outpatient as needed. Discussed usual and customary return precautions. Patient/family verbalize understanding and are comfortable with this plan.  Outpatient follow-up has been provided if needed. All questions have been answered.      Date: 08/05/2017 1:17AM  Rate: 91  Rhythm: normal sinus rhythm  QRS Axis: normal  Intervals: normal  ST/T Wave abnormalities: normal  Conduction Disutrbances: none  Narrative Interpretation: unremarkable; no significant change compared to previous        Chris Jackson, DelphiKristen N, DO 08/05/17 [id number]

## 2018-07-31 IMAGING — CT CT ABD-PELV W/ CM
2 of 4 series · 16 of 46 positions shown, 18 images · IV contrast (ISOVUE)
Comparison: None.

CLINICAL DATA: Abdominal pain and nausea

EXAM:
CT ABDOMEN AND PELVIS WITH CONTRAST
TECHNIQUE: Multidetector CT imaging of the abdomen and pelvis was performed
using the standard protocol following bolus administration of
intravenous contrast.
CONTRAST:  100 mL iopamidol ([id number]) 61 % injection

[Series 2: axial st · axial · 0.66mm/px · z∈[-556,-161]mm · 13 of 89 slices shown, 15 images]
[im 5/89  soft-tissue]
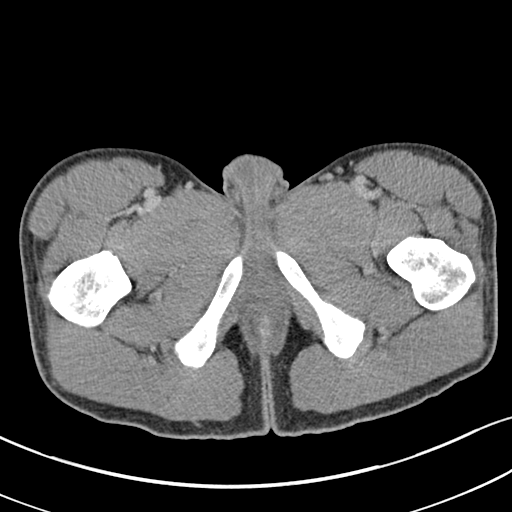
[im 5/89  bone]
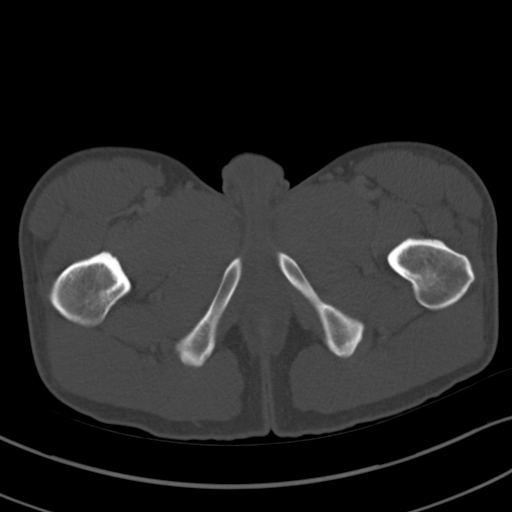
[im 13/89  soft-tissue]
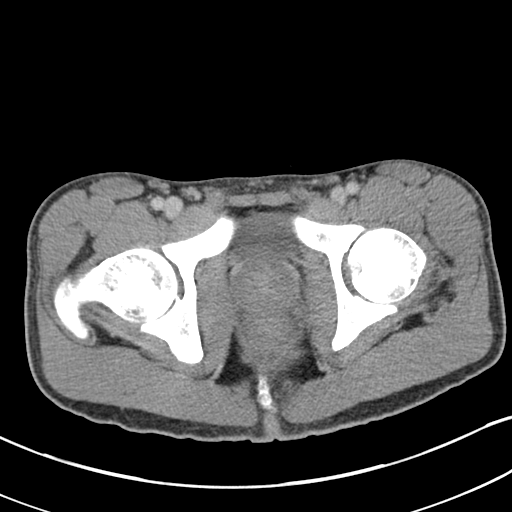
[im 17/89  soft-tissue]
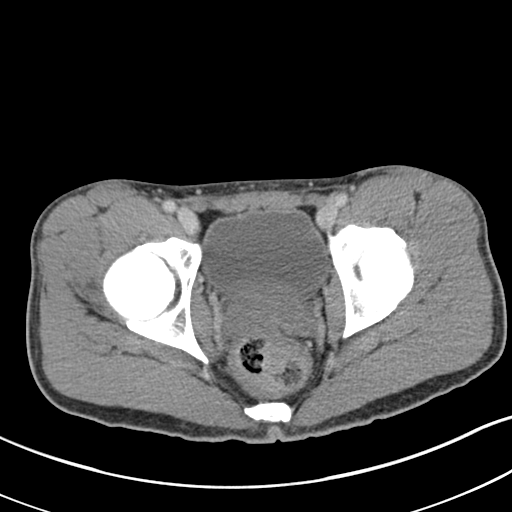
[im 26/89  soft-tissue]
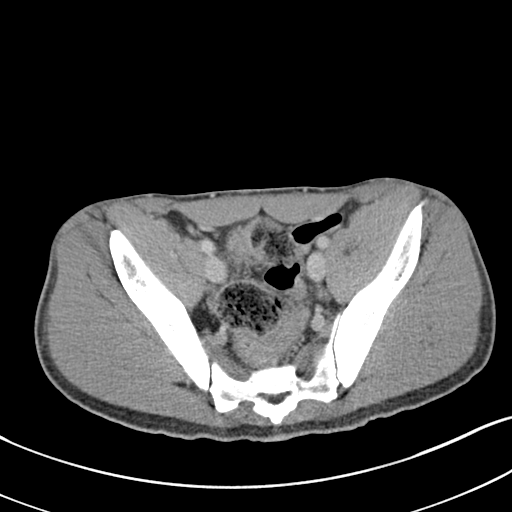
[im 30/89  soft-tissue]
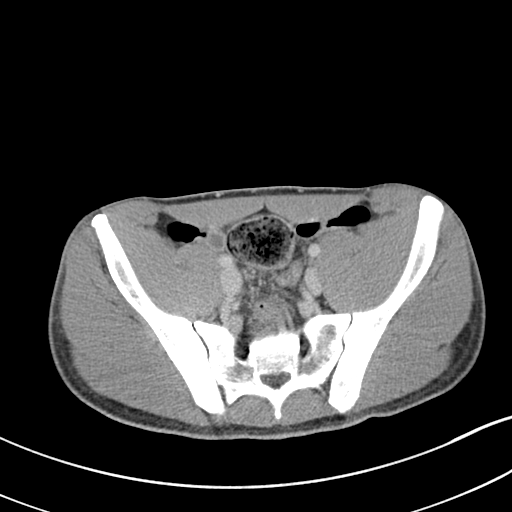
[im 38/89  soft-tissue]
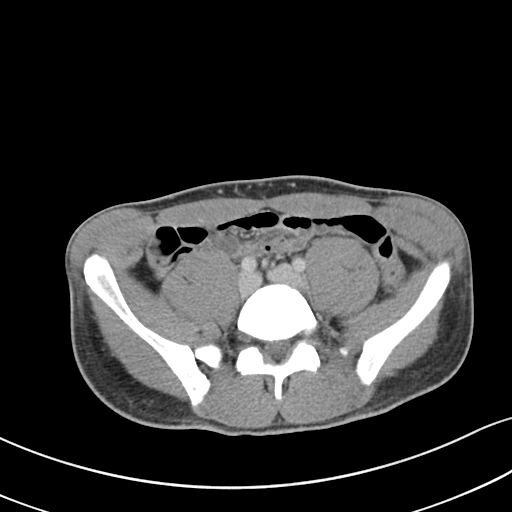
[im 47/89  soft-tissue]
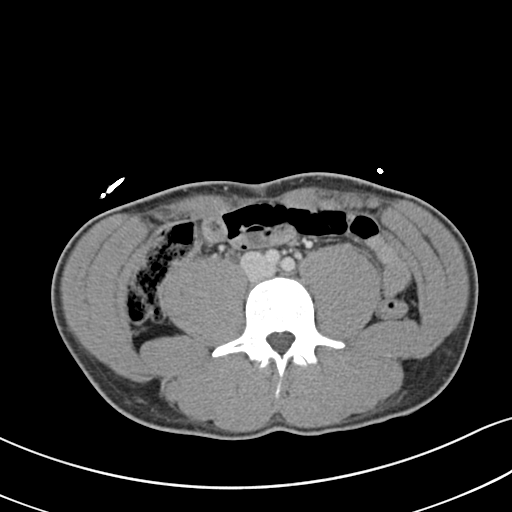
[im 51/89  soft-tissue]
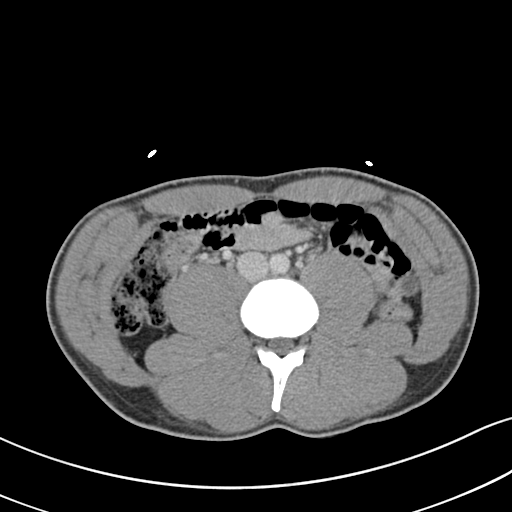
[im 59/89  soft-tissue]
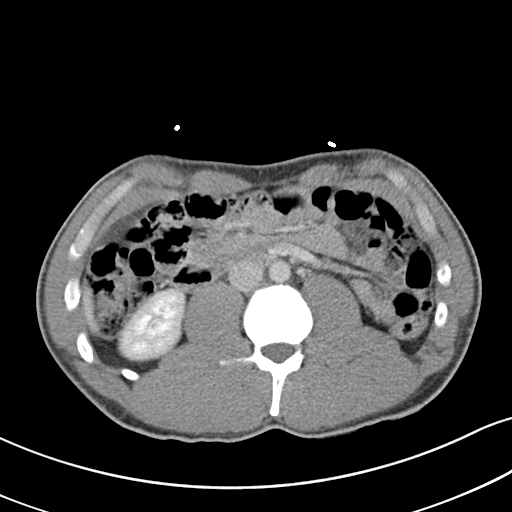
[im 59/89  bone]
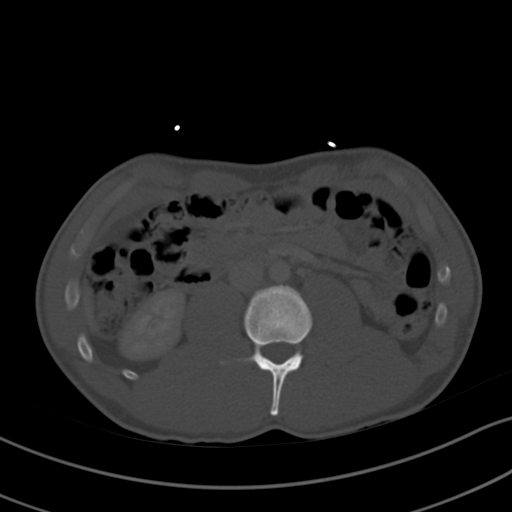
[im 63/89  soft-tissue]
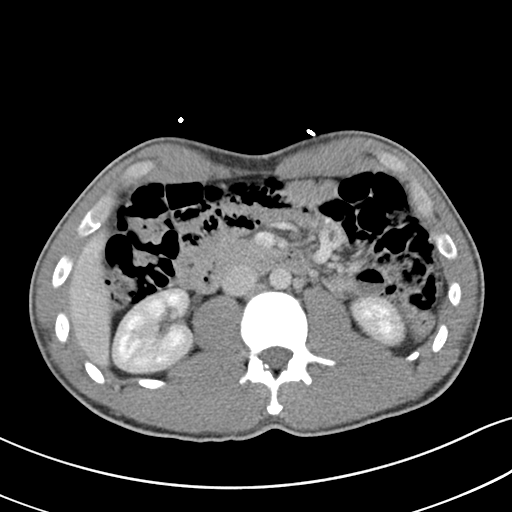
[im 72/89  soft-tissue]
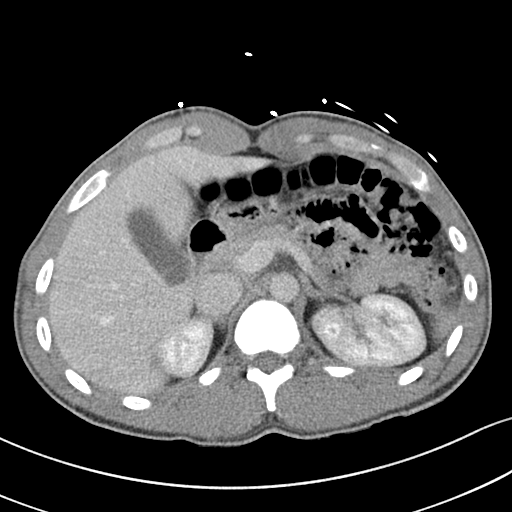
[im 76/89  soft-tissue]
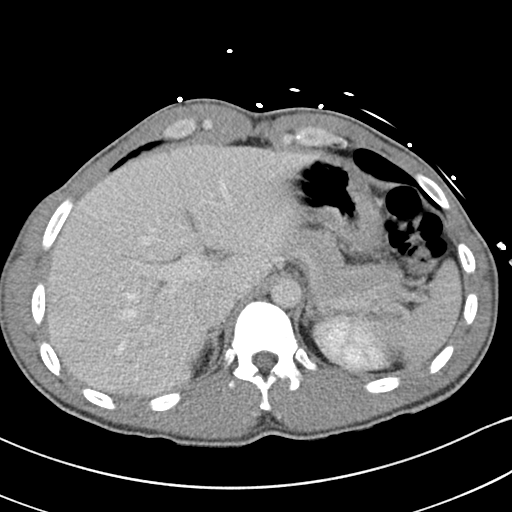
[im 84/89  soft-tissue]
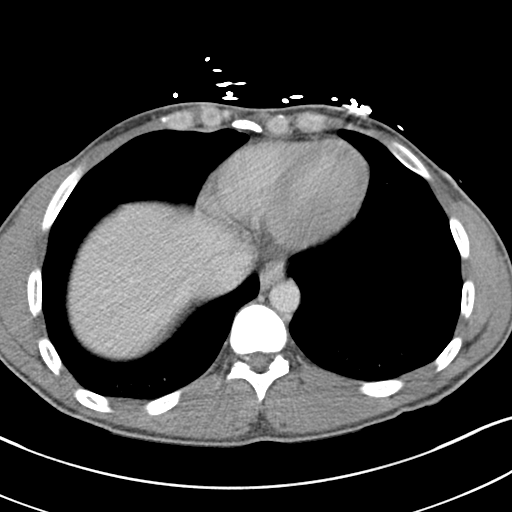

[Series 5: coronal st · coronal · 0.64mm/px · 3 of 73 slices shown]
[im 25/73  soft-tissue]
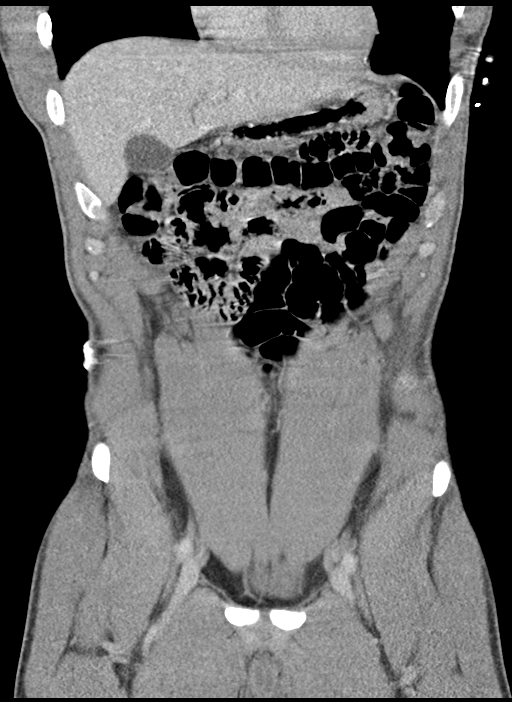
[im 33/73  soft-tissue]
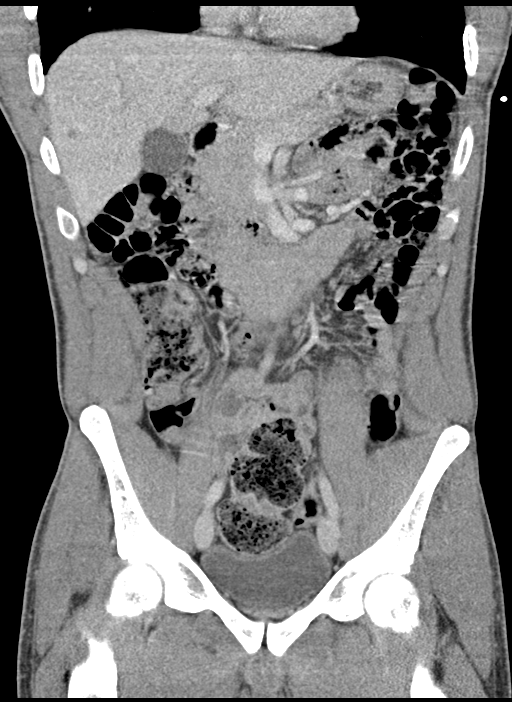
[im 41/73  soft-tissue]
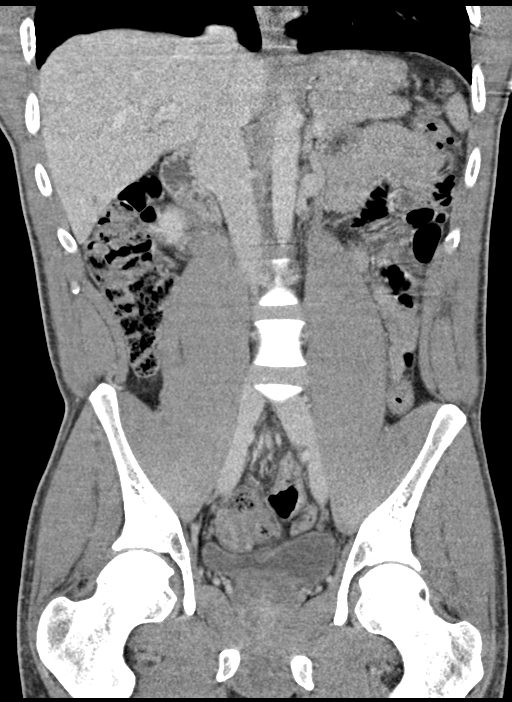

[16 of 46 positions shown; findings below may reference images not displayed]

FINDINGS: Lower chest: No pulmonary nodules or pleural effusion. No visible
pericardial effusion.

Hepatobiliary: Normal hepatic contours and density. No visible
biliary dilatation. Normal gallbladder.

Pancreas: Normal contours without ductal dilatation. No
peripancreatic fluid collection.

Spleen: Normal.

Adrenals/Urinary Tract:

--Adrenal glands: Normal.

--Right kidney/ureter: No hydronephrosis or perinephric stranding.
No nephrolithiasis. No obstructing ureteral stones.

--Left kidney/ureter: No hydronephrosis or perinephric stranding. No
nephrolithiasis. No obstructing ureteral stones.

--Urinary bladder: Unremarkable.

Stomach/Bowel:

--Stomach/Duodenum: No hiatal hernia or other gastric abnormality.
Normal duodenal course and caliber.

--Small bowel: No dilatation or inflammation.

--Colon: Moderate amount of distal colonic stool. There is moderate
wall thickening of the rectum without induration of the adjacent
fat.

--Appendix: Normal.

Vascular/Lymphatic: Normal course and caliber of the major abdominal
vessels. No abdominal or pelvic lymphadenopathy.

Reproductive: Normal prostate and seminal vesicles.

Musculoskeletal. No bony spinal canal stenosis or focal osseous
abnormality.

Other: None.
IMPRESSION: 1. No acute abnormality of the abdomen or pelvis.
2. Mild wall thickening of the rectum without surrounding
inflammatory change. This may indicate a chronic irritative process,
possibly related to increased distal stool volume and/or
constipation.
